# Patient Record
Sex: Female | Born: 1935 | Race: White | Hispanic: No
Health system: Midwestern US, Academic
[De-identification: ages and names within clinical notes are randomized; demographics above are authoritative.]

---

## 2017-06-01 ENCOUNTER — Ambulatory Visit: Admit: 2017-06-01 | Discharge: 2017-06-02 | Payer: MEDICARE

## 2017-06-02 ENCOUNTER — Ambulatory Visit: Admit: 2017-06-02 | Discharge: 2017-06-03 | Payer: MEDICARE

## 2017-06-02 ENCOUNTER — Encounter: Admit: 2017-06-02 | Discharge: 2017-06-02 | Payer: MEDICARE

## 2017-06-02 DIAGNOSIS — I4892 Unspecified atrial flutter: Principal | ICD-10-CM

## 2017-06-02 DIAGNOSIS — R42 Dizziness and giddiness: ICD-10-CM

## 2017-06-02 DIAGNOSIS — R600 Localized edema: ICD-10-CM

## 2017-06-02 DIAGNOSIS — R55 Syncope and collapse: ICD-10-CM

## 2017-06-02 DIAGNOSIS — I480 Paroxysmal atrial fibrillation: ICD-10-CM

## 2017-06-02 DIAGNOSIS — E785 Hyperlipidemia, unspecified: Principal | ICD-10-CM

## 2017-06-02 MED ORDER — POTASSIUM CHLORIDE 10 MEQ PO TBER
10 meq | ORAL_CAPSULE | Freq: Every day | ORAL | 3 refills | 30.00000 days | Status: AC
Start: 2017-06-02 — End: 2018-05-23

## 2017-06-02 MED ORDER — FUROSEMIDE 20 MG PO TAB
20 mg | ORAL_TABLET | Freq: Every morning | ORAL | 3 refills | 90.00000 days | Status: AC
Start: 2017-06-02 — End: 2017-11-28

## 2017-06-08 ENCOUNTER — Encounter: Admit: 2017-06-08 | Discharge: 2017-06-08 | Payer: MEDICARE

## 2017-06-09 ENCOUNTER — Encounter: Admit: 2017-06-09 | Discharge: 2017-06-09 | Payer: MEDICARE

## 2017-06-09 DIAGNOSIS — R600 Localized edema: Principal | ICD-10-CM

## 2017-06-09 LAB — BASIC METABOLIC PANEL
Lab: 15 — ABNORMAL HIGH (ref 0–14)
Lab: 9.3
Lab: 1.1 K/UL — ABNORMAL HIGH (ref 0.57–1.11)
Lab: 102 K/UL — ABNORMAL HIGH (ref 1.8–7.0)
Lab: 138 % (ref >60)
Lab: 20 10*3/uL (ref 0–0.80)
Lab: 25 10*3/uL (ref 1.0–4.8)
Lab: 4.3 % (ref >60)
Lab: 83 10*3/uL (ref 0–0.20)

## 2017-06-22 ENCOUNTER — Encounter: Admit: 2017-06-22 | Discharge: 2017-06-22 | Payer: MEDICARE

## 2017-06-22 NOTE — Telephone Encounter
Amy, HHRN called and states that patient has been feeling dizzy on and off and her blood pressure was low.  Pt's blood pressure today was 105/55.  Called pt to discuss and spoke with pt's daughter.  She states that she has been having these days on and off where she just doesn't feel very good and has dizziness and fatigue.  Her daughter states that some days, it's like her mom has trouble getting her thoughts in order.  Offered an appointment on 6/28 with TLR tomorrow in [place].  Pt would like to keep scheduled appointment with Dr. Ricard Dillon on 7/10.  They will callback if symptoms worsen or if any new symptoms arise.

## 2017-07-05 ENCOUNTER — Ambulatory Visit: Admit: 2017-07-05 | Discharge: 2017-07-06 | Payer: MEDICARE

## 2017-07-05 ENCOUNTER — Encounter: Admit: 2017-07-05 | Discharge: 2017-07-05 | Payer: MEDICARE

## 2017-07-05 DIAGNOSIS — I4892 Unspecified atrial flutter: ICD-10-CM

## 2017-07-05 DIAGNOSIS — I480 Paroxysmal atrial fibrillation: ICD-10-CM

## 2017-07-05 DIAGNOSIS — R55 Syncope and collapse: ICD-10-CM

## 2017-07-05 DIAGNOSIS — E785 Hyperlipidemia, unspecified: Principal | ICD-10-CM

## 2017-07-05 NOTE — Progress Notes
Date of Service: 07/05/2017    Marilyn Cowan is a 83 y.o. female.       HPI     He was in the [place] office today with her daughter.  She had a dizzy spell last Friday.  Her daughter says that she's having them about once weekly.  The episodes typically last a few minutes.  She gets near syncopal when these episodes happen.    We have talked about the possibility of admitting her to [facility] to switch antiarrhythmic medications but it occurs to me that these episodes of lightheadedness may actually occur when she is converting from atrial fibrillation back to sinus rhythm.  She probably has enough sinus node dysfunction that she has a bit of a pause at the time of conversion and this is probably what is causing these episodes of lightheadedness.  I am not sure that she can even tell that she is in atrial fibrillation anymore and I really question whether a rhythm control strategy is all that important anymore.  I suspect if we discontinue the flecainide these episodes of lightheadedness will resolve completely and we will even need to consider the possibility of a pacemaker or a different antiarrhythmic medication.    We talked all this over today and I think both Bebe and her daughter think it sounds like a good idea to simply stop the flecainide and see how she does subsequently.         Vitals:    07/05/17 1055 07/05/17 1106   BP: 140/74 130/76   Pulse: 71    Weight: 65 kg (143 lb 6.4 oz)    Height: 1.626 m (5' 4)      Body mass index is 24.61 kg/m???.     Past Medical History  Patient Active Problem List    Diagnosis Date Noted   ??? Localized edema 06/02/2017   ??? Paroxysmal atrial fibrillation (HCC) 06/02/2017   ??? Thrombocytosis (HCC) 01/08/2014     12/18/13:  Platelet count 832,000.  WBC 9.6, Hgb 13.6.  Normal WBC differential.  01/23/2014:  Hematology consultation ([facility]):  JAK-2 mutation positive.  Hydrea started.  Supplemental iron started for mild deficiency 01/18/14 Abdominal Ultrasound (ordered by Hematology):  12.5 mm simple cyst lower pole L kidney.  Post chole.  Otherwise unremarkable.  Spleen/liver OK.     ??? Macular degeneration 07/17/2013   ??? 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 Paroxysmal atrial flutter (HCC) 05/13/2012     4/7 through 04/05/17 [facility] admission for paroxysmal atrial fibrillation, presenting as fatigue and dyspnea.  Treated with IV diltiazem, converted spontaneously to sinus rhythm.  Eliquis started.       ??? Lightheadedness 05/12/2012   ??? Chest pain 12/22/2011     11/10/2011 [facility] for Mid sternal chest pain.     11/15/2006 This study is low probability for significant jeopardized ischemic myocardium. Some mild defects at the insertion site of the right ventricular myocardium do not appear to be statistically significant. Otherwise, all myocardial segments are definitely viable. Global left ventricular function is within normal limits. Other high risk indicators are not noted.    05/15/12: Normal coronaries with borderline decreased EF, per cath by Dr. Steward Ros    12/18/13:  [facility] ED visit for chest pain, vertigo.  Troponin < 0.02.  Pro-BNP 2402 (0-1800 normal).  TSH 2.82.  pCXR normal (no pulmonary vascular congestion).  EKG:  Sinus rhythm, LBBB.         ??? Hyperlipidemia 12/22/2011  History of myalgia on statin in the past    2004 - Last lipid profile in [facility]:  Total 204, LDL 128, HDL 42, Trig 122.     ??? Pre-syncope 12/22/2011     01/15/2014- Duplex scan carotid-[facility]-Impression:  1.  Some minimal atherosclerotic plaque evident bilaterally in the carotid bulb and origin internal carotid arteries without evident significant stenosis.  Findings consistent with less than a 50% diameter stenosis.2.  Cephalad vertebral flow           Review of Systems   Constitution: Negative.   HENT: Negative.    Eyes: Negative.    Cardiovascular: Positive for dyspnea on exertion, irregular heartbeat, leg swelling and near-syncope.   Respiratory: Negative.    Endocrine: Negative.    Hematologic/Lymphatic: Negative.    Skin: Negative.    Musculoskeletal: Negative.    Gastrointestinal: Negative.    Genitourinary: Negative.    Neurological: Negative.    Psychiatric/Behavioral: Negative.    Allergic/Immunologic: Negative.        Physical Exam    Physical Exam   General Appearance: no distress   Skin: warm, no ulcers or xanthomas   Digits and Nails: no cyanosis or clubbing   Eyes: conjunctivae and lids normal, pupils are equal and round   Teeth/Gums/Palate: dentition unremarkable, no lesions   Lips & Oral Mucosa: no pallor or cyanosis   Neck Veins: normal JVP , neck veins are not distended   Thyroid: no nodules, masses, tenderness or enlargement   Chest Inspection: chest is normal in appearance   Respiratory Effort: breathing comfortably, no respiratory distress   Auscultation/Percussion: lungs clear to auscultation, no rales or rhonchi, no wheezing   PMI: PMI not enlarged or displaced   Cardiac Rhythm: irregular rhythm and normal rate   Cardiac Auscultation: S1, S2 normal, no rub, no gallop   Murmurs: no murmur   Peripheral Circulation: normal peripheral circulation   Carotid Arteries: normal carotid upstroke bilaterally, no bruits   Radial Arteries: normal symmetric radial pulses   Abdominal Aorta: no abdominal aortic bruit   Pedal Pulses: normal symmetric pedal pulses   Lower Extremity Edema: no lower extremity edema   Abdominal Exam: soft, non-tender, no masses, bowel sounds normal   Liver & Spleen: no organomegaly   Gait & Station: walks without assistance   Muscle Strength: normal muscle tone   Orientation: oriented to time, place and person   Affect & Mood: appropriate and sustained affect   Language and Memory: patient responsive and seems to comprehend information   Neurologic Exam: neurological assessment grossly intact   Other: moves all extremities      Cardiovascular Studies EKG:  Atrial fibrillation, rate 71.  LBBB.    Problems Addressed Today  Encounter Diagnoses   Name Primary?   ??? Paroxysmal atrial flutter (HCC) Yes   ??? Paroxysmal atrial fibrillation (HCC)        Assessment and Plan       Paroxysmal atrial flutter (HCC)  She could not tell if she was in atrial fibrillation today.  I think we pretty well decided to abandon the rhythm control strategy in atrial fibrillation on anticoagulation.  Hopefully this will help to resolve any need for a pacemaker or hospitalization to switch antiarrhythmic medications.    Fortunately she is tolerating oral anticoagulation without significant.  She might be a candidate for a left atrial appendage occlusion device in the future if anticoagulation becomes more challenge.      Current Medications (including  today's revisions)  ??? apixaban (ELIQUIS) 5 mg tablet Take 5 mg by mouth twice daily.   ??? [START ON 09/06/2017] clindamycin(+) (CLEOCIN) 300 mg capsule Take 2 Capsules by mouth 30 minutes prior to procedure on 09/06/17   ??? diltiazem CD (CARDIZEM CD) 240 mg capsule Take 240 mg by mouth daily.   ??? furosemide (LASIX) 20 mg tablet Take 1 tablet by mouth every morning.   ??? HYDROXYUREA PO Take 500 mg by mouth. 1 tablet 4 days weekly, 2 tablets 3 days weekly   ??? potassium chloride (K-DUR) 10 mEq tablet Take 1 tablet by mouth daily. Take with a meal and a full glass of water.

## 2017-07-22 ENCOUNTER — Encounter: Admit: 2017-07-22 | Discharge: 2017-07-22 | Payer: MEDICARE

## 2017-07-22 DIAGNOSIS — I498 Other specified cardiac arrhythmias: ICD-10-CM

## 2017-07-22 DIAGNOSIS — I4892 Unspecified atrial flutter: Principal | ICD-10-CM

## 2017-07-22 NOTE — Telephone Encounter
Called and discussed Dr. Ricard Dillon recommendations with patient.  She is agreeable to plan.  Scheduled her to have holter placed in [place] on Tuesday at 1:00.  Pt confirmed appointment time and location.

## 2017-07-22 NOTE — Telephone Encounter
-----   Message from Michiel Cowboy, MD sent at 07/22/2017  1:18 PM CDT -----  So Marilyn Cowan feels a lot better off the flecainide--I think it's because she's just in AF all the time and not converting back and forth.    Anyway, can you please call her to set up a 48-hour Holter monitor some time in the next week or 2?  Thanks!

## 2017-07-26 ENCOUNTER — Ambulatory Visit: Admit: 2017-07-26 | Discharge: 2017-07-27 | Payer: MEDICARE

## 2017-07-26 ENCOUNTER — Encounter: Admit: 2017-07-26 | Discharge: 2017-07-26 | Payer: MEDICARE

## 2017-07-26 DIAGNOSIS — I498 Other specified cardiac arrhythmias: ICD-10-CM

## 2017-07-26 DIAGNOSIS — I4892 Unspecified atrial flutter: Principal | ICD-10-CM

## 2017-07-26 NOTE — Progress Notes
Ordering Physician:Dr. Tyson Alias   Holter [phone] Card number [phone]  Hours:48 hrs  VV:[id number] atrial flutter  Location: [facility]  Technician applied Holter:TV

## 2017-08-02 ENCOUNTER — Encounter: Admit: 2017-08-02 | Discharge: 2017-08-02 | Payer: MEDICARE

## 2017-08-11 ENCOUNTER — Encounter: Admit: 2017-08-11 | Discharge: 2017-08-11 | Payer: MEDICARE

## 2017-08-11 NOTE — Progress Notes
Patient presented to clinic for nursing visit for blood pressure check.  Today blood pressure is 124/74 p 90.  Patient reports she is feeling better since stopping the flecainide.  She does report a couple of episodes of dizziness, but only lasting a few seconds.  Discussed with SDO, no change in current care plan.  Continue current medications and pt has f/u schedule in Oct. Asked pt to call if she has any questions or concerns.  Pt verbalizes understanding and agrees with plan of care.

## 2017-08-31 ENCOUNTER — Encounter: Admit: 2017-08-31 | Discharge: 2017-08-31 | Payer: MEDICARE

## 2017-08-31 DIAGNOSIS — R55 Syncope and collapse: Principal | ICD-10-CM

## 2017-08-31 MED ORDER — CLINDAMYCIN HCL 300 MG PO CAP
ORAL_CAPSULE | 0 refills | Status: AC
Start: 2017-08-31 — End: ?

## 2017-09-01 ENCOUNTER — Encounter: Admit: 2017-09-01 | Discharge: 2017-09-01 | Payer: MEDICARE

## 2017-09-01 NOTE — Progress Notes
Medicare is listed as patient's primary insurance coverage.  Pre-certification is not required for hospitalizations.

## 2017-09-03 NOTE — Assessment & Plan Note
She could not tell if she was in atrial fibrillation today.  I think we pretty well decided to abandon the rhythm control strategy in atrial fibrillation on anticoagulation.  Hopefully this will help to resolve any need for a pacemaker or hospitalization to switch antiarrhythmic medications.    Fortunately she is tolerating oral anticoagulation without significant.  She might be a candidate for a left atrial appendage occlusion device in the future if anticoagulation becomes more challenge.

## 2017-09-06 ENCOUNTER — Encounter: Admit: 2017-09-06 | Discharge: 2017-09-06 | Payer: MEDICARE

## 2017-09-06 ENCOUNTER — Ambulatory Visit: Admit: 2017-09-06 | Discharge: 2017-09-06 | Payer: MEDICARE

## 2017-09-06 DIAGNOSIS — I481 Persistent atrial fibrillation: ICD-10-CM

## 2017-09-06 DIAGNOSIS — R55 Syncope and collapse: Principal | ICD-10-CM

## 2017-09-06 DIAGNOSIS — E785 Hyperlipidemia, unspecified: Principal | ICD-10-CM

## 2017-09-06 DIAGNOSIS — I480 Paroxysmal atrial fibrillation: ICD-10-CM

## 2017-09-06 DIAGNOSIS — I4892 Unspecified atrial flutter: ICD-10-CM

## 2017-09-06 NOTE — Progress Notes
Date of Service: 09/06/2017    Marilyn Cowan is a 83 y.o. female.       HPI     Marilyn Cowan presents to my office today in electrophysiology follow-up for history of atrial arrhythmias.  As you know, she is a pleasant 83 year old female with a past medical history of persistent atrial arrhythmias on anticoagulation, macular degeneration, and hyperlipidemia who is typically followed by my colleague Dr. Edwinna Areola.  Dr. Barry Dienes last saw Marilyn Cowan in the office back in July.  At that time, the patient was complaining of worsening dizzy spells.  It was felt to be at least partially due to flecainide.  After a long discussion, it was decided to accept her atrial fibrillation as permanent and discontinue the flecainide.    The patient subsequently underwent a 48 hour Holter monitor.  It showed atrial fibrillation throughout.  The average ventricular rate was 91 bpm with a range between 56 and 64 bpm.  She was started on low-dose beta-blockers.    Marilyn Cowan tells me that she continues to have some dizzy spells.  They are brief in nature and not as often as previous.  She has had x-rays of her back and neck done and has been told she has arthritis.  The patient did have one fall back in January when she tells me her left leg gave out.  She ended up fracturing her pelvis.  She remains on anticoagulation and has had no falls since then.         Vitals:    09/06/17 0849   BP: 120/80   Pulse: 78   Weight: 64.4 kg (142 lb)   Height: 1.626 m (5' 4)     Body mass index is 24.37 kg/m???.     Past Medical History  Patient Active Problem List    Diagnosis Date Noted   ??? Localized edema 06/02/2017   ??? Persistent atrial fibrillation (HCC) 06/02/2017   ??? Thrombocytosis (HCC) 01/08/2014     12/18/13:  Platelet count 832,000.  WBC 9.6, Hgb 13.6.  Normal WBC differential.  01/23/2014:  Hematology consultation ([facility]):  JAK-2 mutation positive.  Hydrea started.  Supplemental iron started for mild deficiency 01/18/14 Abdominal Ultrasound (ordered by Hematology):  12.5 mm simple cyst lower pole L kidney.  Post chole.  Otherwise unremarkable.  Spleen/liver OK.     ??? Macular degeneration 07/17/2013   ??? 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 Paroxysmal atrial flutter (HCC) 05/13/2012     4/7 through 04/05/17 [facility] admission for paroxysmal atrial fibrillation, presenting as fatigue and dyspnea.  Treated with IV diltiazem, converted spontaneously to sinus rhythm.  Eliquis started.       ??? Lightheadedness 05/12/2012   ??? Chest pain 12/22/2011     11/10/2011 [facility] for Mid sternal chest pain.     11/15/2006 This study is low probability for significant jeopardized ischemic myocardium. Some mild defects at the insertion site of the right ventricular myocardium do not appear to be statistically significant. Otherwise, all myocardial segments are definitely viable. Global left ventricular function is within normal limits. Other high risk indicators are not noted.    05/15/12: Normal coronaries with borderline decreased EF, per cath by Dr. Steward Ros    12/18/13:  [facility] ED visit for chest pain, vertigo.  Troponin < 0.02.  Pro-BNP 2402 (0-1800 normal).  TSH 2.82.  pCXR normal (no pulmonary vascular congestion).  EKG:  Sinus rhythm, LBBB.         ???  Hyperlipidemia 12/22/2011     History of myalgia on statin in the past    2004 - Last lipid profile in [facility]:  Total 204, LDL 128, HDL 42, Trig 122.     ??? Pre-syncope 12/22/2011     01/15/2014- Duplex scan carotid-[facility]-Impression:  1.  Some minimal atherosclerotic plaque evident bilaterally in the carotid bulb and origin internal carotid arteries without evident significant stenosis.  Findings consistent with less than a 50% diameter stenosis.2.  Cephalad vertebral flow           Review of Systems   Constitution: Negative.   HENT: Positive for hearing loss.    Eyes: Positive for blurred vision. Cardiovascular: Positive for irregular heartbeat, leg swelling and near-syncope.   Respiratory: Positive for shortness of breath and sputum production.    Endocrine: Negative.    Hematologic/Lymphatic: Bruises/bleeds easily.   Skin: Positive for dry skin.   Musculoskeletal: Positive for arthritis, back pain, joint pain, muscle cramps, neck pain and stiffness.   Gastrointestinal: Positive for bowel incontinence, flatus and jaundice.   Genitourinary: Positive for frequency and nocturia.   Neurological: Positive for excessive daytime sleepiness.   Psychiatric/Behavioral: Negative.    Allergic/Immunologic: Negative.        Physical Exam  General Appearance: elderly, frail in no acute distress  Skin: warm, moist, no ulcers  HEENT: extraocular movements intact, oropharynx clear  Neck Veins: neck veins are flat, neck veins are not distended  Carotid Arteries: normal carotid upstroke bilaterally, no bruits  Chest Inspection: chest is normal in appearance  Auscultation/Percussion: lungs clear to auscultation, no rales, rhonchi, or wheezing  Cardiac Rhythm: regular rhythm and normal rate  Cardiac Auscultation: Normal S1 & S2, no S3 or S4, no rub  Murmurs: no cardiac murmurs   Extremities: no lower extremity edema; 1+ symmetric distal pulses  Abdominal Exam: soft, non-tender, no masses, bowel sounds normal  Liver & Spleen: no organomegaly  Neurologic Exam: neurological assessment grossly intact      Cardiovascular Studies  12 lead EKG:  Atrial fibrillation, ventricular rate 78 bpm, LBBB, single PVC, QRSd 148 msec, QTc 497 msec    Problems Addressed Today  Encounter Diagnoses   Name Primary?   ??? Persistent atrial fibrillation (HCC) Yes       Assessment and Plan     Persistent atrial fibrillation (HCC)  Her atrial fibrillation is now considered permanent.  Marilyn Cowan feels less dizzy off of flecainide although she is still having events.  We discussed the option of implantable loop recorder placement to look for bradyarrhythmias causing her symptoms.  I reviewed the procedure at length with the patient and daughter including its risks and benefits.  After long discussion, she has decided to proceed.    We will keep you up to date with results of procedures as they occur.    Current Medications (including today's revisions)  ??? apixaban (ELIQUIS) 5 mg tablet Take 5 mg by mouth twice daily.   ??? clindamycin(+) (CLEOCIN) 300 mg capsule Take 2 Capsules by mouth 30 minutes prior to procedure on 09/06/17   ??? diltiazem CD (CARDIZEM CD) 240 mg capsule Take 240 mg by mouth daily.   ??? furosemide (LASIX) 20 mg tablet Take 1 tablet by mouth every morning.   ??? HYDROXYUREA PO Take 500 mg by mouth. 1 tablet 4 days weekly, 2 tablets 3 days weekly   ??? potassium chloride (K-DUR) 10 mEq tablet Take 1 tablet by mouth daily. Take with a meal and  a full glass of water.

## 2017-09-06 NOTE — Assessment & Plan Note
Her atrial fibrillation is now considered permanent.  Marilyn Cowan feels less dizzy off of flecainide although she is still having events.  We discussed the option of implantable loop recorder placement to look for bradyarrhythmias causing her symptoms.  I reviewed the procedure at length with the patient and daughter including its risks and benefits.  After long discussion, she has decided to proceed.

## 2017-09-12 ENCOUNTER — Encounter: Admit: 2017-09-12 | Discharge: 2017-09-12 | Payer: MEDICARE

## 2017-09-12 NOTE — Telephone Encounter
09/06/17- Dr. Lindwood Qua office visit notes-  "Persistent atrial fibrillation [facility])   Her atrial fibrillation is now considered permanent.  Mrs. Stoermer feels less dizzy off of flecainide although she is still having events.  We discussed the option of implantable loop recorder placement to look for bradyarrhythmias causing her symptoms.  I reviewed the  procedure at length with the patient and daughter including its risks and benefits.  After long discussion, she has decided to proceed."      I called the patient to follow up on the ILR transmission. The pt states she has been feeling pretty good. She remembers having an episode yesterday while sitting in the chair with her feet up and she developed dizziness that lasted just a few minutes and then resolved. She does not remember other symptoms to correlate with the transmission.

## 2017-09-12 NOTE — Telephone Encounter
-----   Message from [place] sent at 09/12/2017  8:00 AM CDT -----  Regarding: sdo linq  Presenting EGM: 09/12/17 @ 0004 AF 60's-80's    Initial report received and reviewed 1 symptom activation, 3 AF events. Pt with permanent AF, AF, CareAlerts turned off.    # 4- Symptom- 09/09/17 @ 1300- AF 70's-120's with ectopy  # 3- AF- 09/07/17 @ 1823- event in progress- AF 50's-100's with ectopy    Please see scanned data sheets for further review, will continue to monitor.  Results routed to Dr. Ricard Dillon for signature and review.   ___________________________________________________________________

## 2017-09-13 ENCOUNTER — Ambulatory Visit: Admit: 2017-09-12 | Discharge: 2017-09-13 | Payer: MEDICARE

## 2017-09-13 ENCOUNTER — Encounter: Admit: 2017-09-13 | Discharge: 2017-09-13 | Payer: MEDICARE

## 2017-09-13 DIAGNOSIS — R55 Syncope and collapse: Principal | ICD-10-CM

## 2017-09-13 NOTE — Progress Notes
. (  Left) prepectoral incision is clean, dry, well approximated, and healing without evidence of drainage or discharge. Steri-Strips dry and intact. Pt reports no adverse symptoms. Incision care, including signs and symptoms of infection, reviewed(as below). Pt verbalized understanding and will remain in phone contact.    You may shower once dressing removed at follow up appointment; however avoid direct contact with the incision (allow the water to hit the back of your shoulder rather than directly on the incision).    Do not submerge incision in tub, pool, hot tub, or lake for 4 weeks.    Unless your incision is bleeding or draining, keep it open to air.    Avoid applying deodorants, powders, creams, lotions, etc. to your incision for 4 weeks.    Usually there are no stitches to be removed. Steri-strips will begin to fall off in 10-14 days. If they remain after 2 weeks, gently remove them when they are damp after a shower.    Your incision should gradually look better each day. Please notify our office immediately if you notice any of the following:   -an increase in swelling or redness   -any drainage   -increasing pain at the incision site  -fever over 100 degrees

## 2017-09-20 ENCOUNTER — Encounter: Admit: 2017-09-20 | Discharge: 2017-09-20 | Payer: MEDICARE

## 2017-09-23 ENCOUNTER — Encounter: Admit: 2017-09-23 | Discharge: 2017-09-23 | Payer: MEDICARE

## 2017-09-27 NOTE — Telephone Encounter
-----   Message from Vickie Epley, RN sent at 09/27/2017  3:44 PM CDT -----  Regarding: FW: SDO LINQ pt with symptom triggered events      ----- Message -----  From: [name]  Sent: 09/27/2017   2:49 PM  To: Vickie Epley, RN  Subject: [facility] LINQ pt with symptom triggered events        Presenting EGM on 09/27/2017 @ 00:04:50 shows AF.     Reviewed Event Report shows:    #14 Symptom 10/1 @ 12:13 the 30 sec strip shows AF with V rates between 60's-130's.  #12 Symptom 9/29 @ 07:28 the 30 sec strip shows AF with RVR with V rates between 90's-190's.     Please see scanned data sheets for further review.  Results routed to Dr. Ricard Dillon for signature and review.

## 2017-09-27 NOTE — Progress Notes
Patient states problems with vision.  Believes back pain triggering elevated hr and dizziness.

## 2017-09-30 ENCOUNTER — Encounter: Admit: 2017-09-30 | Discharge: 2017-09-30 | Payer: MEDICARE

## 2017-09-30 NOTE — Telephone Encounter
Spoke with Marilyn Cowan and she was aware of all the events. States she rests and it will go away but does have diaphoresis, dizziness, and "soa". She does not know her BP or pulse at that time.  Marilyn Cowan states she feels "ok" now. She lives next door to her Daughter and has a " first alert" to get help quickly.  Marilyn Cowan advised to initiate the first alert if the episode does not terminate or if symptomatic for an extended time. She states "Oh, I know to do that". Marilyn Cowan has been taking all her medications including eliquis and she has an appointment with Dr.Owens on 10/04/17.

## 2017-09-30 NOTE — Telephone Encounter
-----   Message from Vickie Epley, RN sent at 09/30/2017  9:29 AM CDT -----  Regarding: FW: SDO LINQ pt with symptom triggered event      ----- Message -----  From: [name]  Sent: 09/30/2017   9:15 AM  To: Vickie Epley, RN  Subject: SDO LINQ pt with symptom triggered event         Presenting EGM on 09/30/2017 @ 00:04:50 shows AF/AFL .     Reviewed Event Report shows three symptom triggered events occurred though one was rec'd:    #17 Symptom 10/4 @ 07:12 shows AF/AFL with V rates between 80's-170's.    Please see scanned data sheets for further review.  Results routed to Dr. Ricard Dillon for signature and review.

## 2017-10-04 ENCOUNTER — Encounter: Admit: 2017-10-04 | Discharge: 2017-10-04 | Payer: MEDICARE

## 2017-10-04 ENCOUNTER — Ambulatory Visit: Admit: 2017-10-04 | Discharge: 2017-10-05 | Payer: MEDICARE

## 2017-10-04 DIAGNOSIS — R55 Syncope and collapse: ICD-10-CM

## 2017-10-04 DIAGNOSIS — I4819 Other persistent atrial fibrillation: Principal | ICD-10-CM

## 2017-10-04 DIAGNOSIS — E785 Hyperlipidemia, unspecified: Principal | ICD-10-CM

## 2017-10-04 DIAGNOSIS — I4892 Unspecified atrial flutter: ICD-10-CM

## 2017-10-04 MED ORDER — DILTIAZEM HCL 180 MG PO CP24
180 mg | ORAL_CAPSULE | Freq: Every day | ORAL | 5 refills | 45.00000 days | Status: AC
Start: 2017-10-04 — End: 2017-11-24

## 2017-10-04 MED ORDER — APIXABAN 2.5 MG PO TAB
2.5 mg | ORAL_TABLET | Freq: Two times a day (BID) | ORAL | 11 refills | Status: AC
Start: 2017-10-04 — End: 2017-11-28

## 2017-10-04 NOTE — Assessment & Plan Note
The episodes of near syncope that she used to have do seem to have resolved after we stopped the flecainide.  I really think these were caused by pauses while she converted from atrial fibrillation back to sinus rhythm.  Now that she is in atrial fibrillation all the time these pauses no longer seem to be an issue.

## 2017-10-04 NOTE — Progress Notes
Date of Service: 10/04/2017    Marilyn Cowan is a 83 y.o. female.       HPI     That he was in the [place] office today with her daughter.  [place] and she lives next door to her daughter.  She gets along well and has not had any particular problems with TIA or stroke symptoms.  She has had no significant bleeding complications either.  She is pretty much unaware of her atrial fibrillation and denies any problems with palpitations.  The episodes of dizziness and lightheadedness that she has had in the past seem to have pretty much resolved.  Her weight seems to be stable although she is gradually losing a little bit of weight.         Vitals:    10/04/17 0839 10/04/17 0850   BP: 122/74 126/74   Pulse: 94    Weight: 63 kg (139 lb)    Height: 1.626 m (5' 4)      Body mass index is 23.86 kg/m???.     Past Medical History  Patient Active Problem List    Diagnosis Date Noted   ??? Localized edema 06/02/2017   ??? Persistent atrial fibrillation (HCC) 06/02/2017     ~2015-18 - Flecainide used effectively to suppress paroxysmal atrial arrhythmias    4/7 through 04/05/17 [facility] admission for paroxysmal atrial fibrillation, presenting as fatigue and dyspnea.  Treated with IV diltiazem, converted spontaneously to sinus rhythm.  Eliquis started.    05/2017 - flecainide discontinued due to episodes of near syncope attributed to sinus pauses when converted from AF to SR.       ??? Thrombocytosis (HCC) 01/08/2014     12/18/13:  Platelet count 832,000.  WBC 9.6, Hgb 13.6.  Normal WBC differential.  01/23/2014:  Hematology consultation ([facility]i):  JAK-2 mutation positive.  Hydrea started.  Supplemental iron started for mild deficiency    01/18/14 Abdominal Ultrasound (ordered by Hematology):  12.5 mm simple cyst lower pole L kidney.  Post chole.  Otherwise unremarkable.  Spleen/liver OK.     ??? Macular degeneration 07/17/2013   ??? 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 Lightheadedness 05/12/2012 ??? Chest pain 12/22/2011     11/10/2011 [facility] for Mid sternal chest pain.     11/15/2006 This study is low probability for significant jeopardized ischemic myocardium. Some mild defects at the insertion site of the right ventricular myocardium do not appear to be statistically significant. Otherwise, all myocardial segments are definitely viable. Global left ventricular function is within normal limits. Other high risk indicators are not noted.    05/15/12: Normal coronaries with borderline decreased EF, per cath by Dr. Steward Ros    12/18/13:  [facility] ED visit for chest pain, vertigo.  Troponin < 0.02.  Pro-BNP 2402 (0-1800 normal).  TSH 2.82.  pCXR normal (no pulmonary vascular congestion).  EKG:  Sinus rhythm, LBBB.         ??? Hyperlipidemia 12/22/2011     History of myalgia on statin in the past    2004 - Last lipid profile in [facility]:  Total 204, LDL 128, HDL 42, Trig 122.     ??? Pre-syncope 12/22/2011     01/15/2014- Duplex scan carotid-[facility]-Impression:  1.  Some minimal atherosclerotic plaque evident bilaterally in the carotid bulb and origin internal carotid arteries without evident significant stenosis.  Findings consistent with less than a 50% diameter stenosis.2.  Cephalad vertebral  flow           Review of Systems   Constitution: Negative.   HENT: Negative.    Eyes: Negative.    Cardiovascular: Positive for dyspnea on exertion and irregular heartbeat.   Respiratory: Positive for shortness of breath.    Endocrine: Negative.    Hematologic/Lymphatic: Negative.    Skin: Negative.    Musculoskeletal: Positive for arthritis, back pain, joint pain, neck pain and stiffness.   Gastrointestinal: Negative.    Genitourinary: Negative.    Neurological: Positive for dizziness and light-headedness.   Psychiatric/Behavioral: Negative.    Allergic/Immunologic: Negative.        Physical Exam    Physical Exam   General Appearance: no distress   Skin: warm, no ulcers or xanthomas Digits and Nails: no cyanosis or clubbing   Eyes: conjunctivae and lids normal, pupils are equal and round   Teeth/Gums/Palate: dentition unremarkable, no lesions   Lips & Oral Mucosa: no pallor or cyanosis   Neck Veins: normal JVP , neck veins are not distended   Thyroid: no nodules, masses, tenderness or enlargement   Chest Inspection: chest is normal in appearance   Respiratory Effort: breathing comfortably, no respiratory distress   Auscultation/Percussion: lungs clear to auscultation, no rales or rhonchi, no wheezing   PMI: PMI not enlarged or displaced   Cardiac Rhythm: regular rhythm and normal rate   Cardiac Auscultation: S1, S2 normal, no rub, no gallop   Murmurs: no murmur   Peripheral Circulation: normal peripheral circulation   Carotid Arteries: normal carotid upstroke bilaterally, no bruits   Radial Arteries: normal symmetric radial pulses   Abdominal Aorta: no abdominal aortic bruit   Pedal Pulses: normal symmetric pedal pulses   Lower Extremity Edema: no lower extremity edema   Abdominal Exam: soft, non-tender, no masses, bowel sounds normal   Liver & Spleen: no organomegaly   Gait & Station: walks without assistance   Muscle Strength: normal muscle tone   Orientation: oriented to time, place and person   Affect & Mood: appropriate and sustained affect   Language and Memory: patient responsive and seems to comprehend information   Neurologic Exam: neurological assessment grossly intact   Other: moves all extremities        Problems Addressed Today  Encounter Diagnoses   Name Primary?   ??? Persistent atrial fibrillation (HCC)    ??? Paroxysmal atrial flutter (HCC)    ??? Pre-syncope        Assessment and Plan       Persistent atrial fibrillation (HCC)  She's definitely over 82 and her renal function and weight are just at the border for a reduced (2.5 mg BID) Eliquis dosage so I went ahead and reduced the dosage today.    Her event monitor shows all AF with a pretty good rate control so I think we can try to relax the diltiazem dosage somewhat to help bring her BP up somewhat.    Pre-syncope  The episodes of near syncope that she used to have do seem to have resolved after we stopped the flecainide.  I really think these were caused by pauses while she converted from atrial fibrillation back to sinus rhythm.  Now that she is in atrial fibrillation all the time these pauses no longer seem to be an issue.      Current Medications (including today's revisions)  ??? acetaminophen (TYLENOL) 500 mg tablet Take 500 mg by mouth as Needed for Pain. Max of 4,000 mg of acetaminophen  in 24 hours.   ??? apixaban (ELIQUIS) 2.5 mg tablet Take one tablet by mouth twice daily.   ??? diltiazem CD (CARDIZEM CD) 180 mg capsule Take one capsule by mouth daily.   ??? furosemide (LASIX) 20 mg tablet Take 1 tablet by mouth every morning.   ??? HYDROXYUREA PO Take 500 mg by mouth. 1 tablet 4 days weekly, 2 tablets 3 days weekly   ??? potassium chloride (K-DUR) 10 mEq tablet Take 1 tablet by mouth daily. Take with a meal and a full glass of water.

## 2017-10-04 NOTE — Assessment & Plan Note
She's definitely over 82 and her renal function and weight are just at the border for a reduced (2.5 mg BID) Eliquis dosage so I went ahead and reduced the dosage today.    Her event monitor shows all AF with a pretty good rate control so I think we can try to relax the diltiazem dosage somewhat to help bring her BP up somewhat.

## 2017-10-06 ENCOUNTER — Ambulatory Visit: Admit: 2017-10-06 | Discharge: 2017-10-07 | Payer: MEDICARE

## 2017-10-07 DIAGNOSIS — I481 Persistent atrial fibrillation: ICD-10-CM

## 2017-10-07 DIAGNOSIS — R55 Syncope and collapse: Principal | ICD-10-CM

## 2017-10-17 ENCOUNTER — Encounter: Admit: 2017-10-17 | Discharge: 2017-10-17 | Payer: MEDICARE

## 2017-10-17 NOTE — Progress Notes
Information below noted. Chart review indicates pt is anticoagulated on Eliquis and this is consistent with findings since ILR implanted in Sept of this year. Information has already been sent to Dr. Tyson Alias [facility]) for review and pt sched to see [facility] in f/u in  Jan 2019. No further needs identified at this time.    ----- Message -----  From: [name]  Sent: 10/17/2017  12:04 PM  To: [name] Nurse [facility]  Subject: [place] pt with AF event and stored symptom  triggered events    MyChart message was sent to pt to request a remote transmission to see stored information.  Presenting EGM on 10/15/2017 @ 00:04:50 shows AF with ectopy.     Reviewed Event Report shows four symptom triggered events and one AF event occurred:    #30 Symptom 10/19 @ 10:12 the 30 sec strip shows AF with a variable V rate between 82-150 bpm.     Please see scanned data sheets for further review.  Results routed to Dr. Ricard Dillon for signature and review.

## 2017-10-28 ENCOUNTER — Encounter: Admit: 2017-10-28 | Discharge: 2017-10-28 | Payer: MEDICARE

## 2017-10-31 ENCOUNTER — Encounter: Admit: 2017-10-31 | Discharge: 2017-10-31 | Payer: MEDICARE

## 2017-10-31 NOTE — Telephone Encounter
Called pt to see if she was having symptoms.  Pt denies any chest pain, or palpitations.  She states that she does occasionally have "a shortness that lets you know something is going on".  She states that she is getting a stomach bug and has been having some nausea and diarrhea.  No other complaints.  She will call us back with any additional concerns or problems.

## 2017-10-31 NOTE — Telephone Encounter
-----   Message from Army Melia sent at 10/31/2017 11:08 AM CST -----  Regarding: SDO LINQ pt with symptom triggered events  Presenting EGM on 10/31/2017 @ 00:04:50 shows AF with ectopy.     Reviewed Event Report shows:    #43 Symptom 11/4 @ 10:00 shows AF with ectopy.     Please see scanned data sheets for further review.  Results routed to Dr. Ricard Dillon for signature and review.

## 2017-11-02 LAB — COMPREHENSIVE METABOLIC PANEL
Lab: 0.8
Lab: 1
Lab: 10
Lab: 102 — ABNORMAL HIGH (ref 27.0–31.0)
Lab: 108
Lab: 136
Lab: 17 — ABNORMAL HIGH (ref 0–14)
Lab: 18
Lab: 20
Lab: 21 — ABNORMAL LOW (ref 23–31)
Lab: 28
Lab: 3.8
Lab: 7.4
Lab: 73

## 2017-11-02 LAB — CBC
Lab: 13
Lab: 3.4 — ABNORMAL LOW (ref 4.20–5.40)
Lab: 6.6

## 2017-11-02 LAB — TROPONIN-I

## 2017-11-07 ENCOUNTER — Ambulatory Visit: Admit: 2017-11-07 | Discharge: 2017-11-07 | Payer: MEDICARE

## 2017-11-07 DIAGNOSIS — R55 Syncope and collapse: Principal | ICD-10-CM

## 2017-11-11 ENCOUNTER — Encounter: Admit: 2017-11-11 | Discharge: 2017-11-11 | Payer: MEDICARE

## 2017-11-11 NOTE — Telephone Encounter
lmom requested call back if pt needs sooner appt.

## 2017-11-11 NOTE — Telephone Encounter
Called patient left message on machine requested call back to discuss symptoms possible work in to sooner appt.

## 2017-11-11 NOTE — Telephone Encounter
-----   Message from Vickie Epley, RN sent at 11/10/2017  3:41 PM CST -----  Regarding: FW: sdo linq      ----- Message -----  From: [name]  Sent: 11/10/2017   3:35 PM  To: Vickie Epley, RN  Subject: sdo [name], RNnotified of abnormal transmission   Presenting EGM: 11/10/17 @ 0004 AF SR 79 bpm    Event report received and reviewed 1 new symptom activation  # 49- Symptom- 11/08/17 @ 1741- AF 70's-130's with ectopy    Please see scanned data sheets for further review, will continue to monitor.  Results routed to Dr. Ricard Dillon for signature and review.   ___________________________________________________________________

## 2017-11-11 NOTE — Telephone Encounter
-----   Message from Loralie Champagne sent at 11/11/2017  9:42 AM CST -----  Regarding: Hosp F/U  Pt was scheduled on 11/20 for Hosp F/U, [facility] cxld clinic that day so I r/s with [facility] on 12/13. Should patient be seen sooner if so do you have a work in available?      Thank you.

## 2017-11-16 ENCOUNTER — Encounter: Admit: 2017-11-16 | Discharge: 2017-11-16 | Payer: MEDICARE

## 2017-11-16 NOTE — Telephone Encounter
Called and discussed with patient.  No new symptoms.  Pt is still feeling very fatigued and occasionally dizzy.  Pt has a follow up appointment scheduled with Dr. Ricard Dillon' on 11/29.  She confirmed appointment time and location.

## 2017-11-16 NOTE — Telephone Encounter
-----   Message from Vickie Epley, RN sent at 11/16/2017  8:43 AM CST -----  Regarding: FW: SDO LINQ pt with symptom triggered event      ----- Message -----  From: Army Melia  Sent: 11/16/2017   8:28 AM  To: Vickie Epley, RN  Subject: SDO LINQ pt with symptom triggered event         Presenting EGM on 11/16/2017 @ 00:04:50 shows AFL.     Reviewed Event Report shows:    #50 Symptom 11/20 @ 08:40 shows AF with V rates between 90's-170's.    Please see scanned data sheets for further review.  Results routed to Dr. Ricard Dillon for signature and review.

## 2017-11-22 ENCOUNTER — Encounter: Admit: 2017-11-22 | Discharge: 2017-11-22 | Payer: MEDICARE

## 2017-11-22 NOTE — Telephone Encounter
Patient states fatigue with episodes.

## 2017-11-22 NOTE — Telephone Encounter
-----   Message from Vickie Epley, RN sent at 11/22/2017  2:24 PM CST -----  Regarding: FW: SDO LINQ pt with symptom triggered event      ----- Message -----  From: [name]  Sent: 11/22/2017   2:22 PM  To: Vickie Epley, RN  Subject: SDO LINQ pt with symptom triggered event         Presenting EGM on 11/22/2017 @ 00:04:50 shows AF.     Reviewed Event Report shows:    #51 Symptom 11/24 @ 07:44 shows AF with V rates between 90's-180's    Please see scanned data sheets for further review.  Results routed to Dr. Ricard Dillon for signature and review.

## 2017-11-23 LAB — CBC
Lab: 35
Lab: 388

## 2017-11-24 ENCOUNTER — Encounter: Admit: 2017-11-24 | Discharge: 2017-11-24 | Payer: MEDICARE

## 2017-11-24 ENCOUNTER — Ambulatory Visit: Admit: 2017-11-24 | Discharge: 2017-11-25 | Payer: MEDICARE

## 2017-11-24 DIAGNOSIS — I4819 Other persistent atrial fibrillation: ICD-10-CM

## 2017-11-24 DIAGNOSIS — R55 Syncope and collapse: ICD-10-CM

## 2017-11-24 DIAGNOSIS — R42 Dizziness and giddiness: ICD-10-CM

## 2017-11-24 DIAGNOSIS — I498 Other specified cardiac arrhythmias: Principal | ICD-10-CM

## 2017-11-24 DIAGNOSIS — E785 Hyperlipidemia, unspecified: Principal | ICD-10-CM

## 2017-11-24 DIAGNOSIS — I4892 Unspecified atrial flutter: ICD-10-CM

## 2017-11-24 NOTE — Assessment & Plan Note
We decided earlier this year to abandon a rhythm control strategy.  Unfortunately we have not been able to achieve good rate control because of low blood pressure.    I have suggested that we consider AV node ablation with implantation of a VVIR pacing system.  The question arises as to whether we should consider a biventricular pacing system and I have ordered an echocardiogram to be done Monday in conjunction with the patient's consultation in Dr. Nita Sickle clinic.

## 2017-11-24 NOTE — Progress Notes
Date of Service: 11/24/2017    INSIYA RUDIGER is a 83 y.o. female.       HPI     Foster was in the [place] office today with her daughter and granddaughter.  Her granddaughter is a vascular and Chief Strategy Officer who works at [employer].    Leisha has had recurring episodes of sudden weakness.  She has an implanted cardiac monitor and we have been able to confirm that her rhythm has not changed significantly during these episodes.  She does, however, have persistent atrial fibrillation with a poorly controlled ventricular rate.  We have tried to control her rate but are not limited by low blood pressure readings.  I suspect that these episodes of intermittent weakness are related to low blood pressure.  We had a fairly prolonged discussion today about the possibility of AV node ablation with permanent pacemaker.  Brynlie has a left bundle branch block and her most recent echocardiogram showed an ejection fraction of 45%.  The question of biventricular pacing arises and I have made arrangements for the patient to see Dr. Wallene Huh in our [place] office on Monday for consideration of this type of procedure.    Gizzel had gone with her daughter for phlebotomy and they went to [employer] afterwards.  While she was walking into the store she suddenly felt lightheaded and felt that she might fall.  They got assistance and she used a motorized chair for her shopping.  She says that she still felt a little bit weak when she got back to the car but she never fell.  She is been having episodes like this 2 or 3 times per week and they all seem to be when she is upright doing something during the day.    She does have a home blood pressure monitor and it sounds as if her systolic blood pressure at home tends to be in the range of 105-110 mmHg.  She has not been able to actually check her blood pressure during an episode of lightheadedness, although she was at her doctor's office yesterday and had an episode during which her systolic blood pressure was measured at 108 mmHg.    She denies any frank syncope.  There has been no associated chest discomfort or breathlessness with these episodes.  Her weight is stable and she denies problems with significant peripheral edema, although she does require a low dose of furosemide to help prevent the accumulation of ankle edema.       Vitals:    11/24/17 1241   BP: 104/72   Pulse: 48   Weight: 62.7 kg (138 lb 3.2 oz)   Height: 1.626 m (5' 4)     Body mass index is 23.72 kg/m???.     Past Medical History  Patient Active Problem List    Diagnosis Date Noted   ??? Localized edema 06/02/2017   ??? Persistent atrial fibrillation (HCC) 06/02/2017     ~2015-18 - Flecainide used effectively to suppress paroxysmal atrial arrhythmias    4/7 through 04/05/17 [facility] admission for paroxysmal atrial fibrillation, presenting as fatigue and dyspnea.  Treated with IV diltiazem, converted spontaneously to sinus rhythm.  Eliquis started.    05/2017 - flecainide discontinued due to episodes of near syncope attributed to sinus pauses when converted from AF to SR.       ??? Thrombocytosis (HCC) 01/08/2014     12/18/13:  Platelet count 832,000.  WBC 9.6, Hgb 13.6.  Normal WBC differential.  01/23/2014:  Hematology consultation ([facility]):  JAK-2 mutation positive.  Hydrea started.  Supplemental iron started for mild deficiency    01/18/14 Abdominal Ultrasound (ordered by Hematology):  12.5 mm simple cyst lower pole L kidney.  Post chole.  Otherwise unremarkable.  Spleen/liver OK.     ??? Macular degeneration 07/17/2013   ??? 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 Lightheadedness 05/12/2012   ??? Chest pain 12/22/2011     11/10/2011 [facility] for Mid sternal chest pain.     11/15/2006 This study is low probability for significant jeopardized ischemic myocardium. Some mild defects at the insertion site of the right ventricular myocardium do not appear to be statistically significant. Otherwise, all myocardial segments are definitely viable. Global left ventricular function is within normal limits. Other high risk indicators are not noted.    05/15/12: Normal coronaries with borderline decreased EF, per cath by Dr. Steward Ros    12/18/13:  [facility] ED visit for chest pain, vertigo.  Troponin < 0.02.  Pro-BNP 2402 (0-1800 normal).  TSH 2.82.  pCXR normal (no pulmonary vascular congestion).  EKG:  Sinus rhythm, LBBB.         ??? Hyperlipidemia 12/22/2011     History of myalgia on statin in the past    2004 - Last lipid profile in [facility]:  Total 204, LDL 128, HDL 42, Trig 122.     ??? Pre-syncope 12/22/2011     01/15/2014- Duplex scan carotid-[facility]-Impression:  1.  Some minimal atherosclerotic plaque evident bilaterally in the carotid bulb and origin internal carotid arteries without evident significant stenosis.  Findings consistent with less than a 50% diameter stenosis.2.  Cephalad vertebral flow           Review of Systems   Constitution: Positive for weakness and malaise/fatigue.   HENT: Positive for hearing loss.    Eyes: Negative.    Cardiovascular: Positive for irregular heartbeat.   Respiratory: Negative.    Endocrine: Negative.    Hematologic/Lymphatic: Negative.    Skin: Negative.    Musculoskeletal: Negative.    Gastrointestinal: Negative.    Genitourinary: Negative.    Neurological: Positive for dizziness.   Psychiatric/Behavioral: Positive for memory loss.   Allergic/Immunologic: Negative.        Physical Exam    Physical Exam   General Appearance: no distress   Skin: warm, no ulcers or xanthomas   Digits and Nails: no cyanosis or clubbing   Eyes: conjunctivae and lids normal, pupils are equal and round   Teeth/Gums/Palate: dentition unremarkable, no lesions   Lips & Oral Mucosa: no pallor or cyanosis   Neck Veins: normal JVP , neck veins are not distended   Thyroid: no nodules, masses, tenderness or enlargement Chest Inspection: chest is normal in appearance   Respiratory Effort: breathing comfortably, no respiratory distress   Auscultation/Percussion: lungs clear to auscultation, no rales or rhonchi, no wheezing   PMI: PMI not enlarged or displaced   Cardiac Rhythm: irregular rhythm and normal rate   Cardiac Auscultation: S1, S2 normal, no rub, no gallop   Murmurs: no murmur   Peripheral Circulation: normal peripheral circulation   Carotid Arteries: normal carotid upstroke bilaterally, no bruits   Radial Arteries: normal symmetric radial pulses   Abdominal Aorta: no abdominal aortic bruit   Pedal Pulses: normal symmetric pedal pulses   Lower Extremity Edema: no lower extremity edema   Abdominal Exam: soft, non-tender, no masses, bowel sounds normal   Liver & Spleen: no organomegaly  Gait & Station: walks without assistance   Muscle Strength: normal muscle tone   Orientation: oriented to time, place and person   Affect & Mood: appropriate and sustained affect   Language and Memory: patient responsive and seems to comprehend information   Neurologic Exam: neurological assessment grossly intact   Other: moves all extremities        Problems Addressed Today  Encounter Diagnoses   Name Primary?   ??? Atrial bigeminy Yes   ??? Persistent atrial fibrillation (HCC)    ??? Lightheadedness    ??? Pre-syncope        Assessment and Plan       Persistent atrial fibrillation (HCC)  We decided earlier this year to abandon a rhythm control strategy.  Unfortunately we have not been able to achieve good rate control because of low blood pressure.    I have suggested that we consider AV node ablation with implantation of a VVIR pacing system.  The question arises as to whether we should consider a biventricular pacing system and I have ordered an echocardiogram to be done Monday in conjunction with the patient's consultation in Dr. Johnanna Schneiders clinic.    Pre-syncope  I suppose the episodes of intermittent lightheadedness could be related to cardiac ischemia, but the patient underwent coronary arteriography in 2013 and had essentially no obstructive coronary disease at that time.  I mentioned this possibility to the patient's granddaughter today neither of [place] are very anxious to embark on a workup for obstructive coronary disease.  It would seem that our efforts to control the ventricular rate would be more helpful to the patient at this point.      Current Medications (including today's revisions)  ??? acetaminophen (TYLENOL) 500 mg tablet Take 500 mg by mouth as Needed for Pain. Max of 4,000 mg of acetaminophen in 24 hours.   ??? apixaban (ELIQUIS) 2.5 mg tablet Take one tablet by mouth twice daily.   ??? diltiazem (CARDIZEM) 120 mg tab Take 120 mg by mouth twice daily.   ??? furosemide (LASIX) 20 mg tablet Take 1 tablet by mouth every morning.   ??? HYDROXYUREA PO Take 500 mg by mouth. 1 tablet 3 days weekly, 2 tablets 4 days weekly   ??? potassium chloride (K-DUR) 10 mEq tablet Take 1 tablet by mouth daily. Take with a meal and a full glass of water.   ??? vit C/E/Zn/coppr/lutein/zeaxan (PRESERVISION AREDS-2 PO) Take  by mouth daily.

## 2017-11-24 NOTE — Assessment & Plan Note
I suppose the episodes of intermittent lightheadedness could be related to cardiac ischemia, but the patient underwent coronary arteriography in 2013 and had essentially no obstructive coronary disease at that time.  I mentioned this possibility to the patient's granddaughter today neither of [place] are very anxious to embark on a workup for obstructive coronary disease.  It would seem that our efforts to control the ventricular rate would be more helpful to the patient at this point.

## 2017-11-25 ENCOUNTER — Encounter: Admit: 2017-11-25 | Discharge: 2017-11-25 | Payer: MEDICARE

## 2017-11-25 DIAGNOSIS — I4819 Other persistent atrial fibrillation: ICD-10-CM

## 2017-11-25 DIAGNOSIS — I498 Other specified cardiac arrhythmias: Principal | ICD-10-CM

## 2017-11-28 ENCOUNTER — Ambulatory Visit: Admit: 2017-11-28 | Discharge: 2017-11-29 | Payer: MEDICARE

## 2017-11-28 ENCOUNTER — Ambulatory Visit: Admit: 2017-11-28 | Discharge: 2017-11-28 | Payer: MEDICARE

## 2017-11-28 ENCOUNTER — Encounter: Admit: 2017-11-28 | Discharge: 2017-11-28 | Payer: MEDICARE

## 2017-11-28 DIAGNOSIS — I4892 Unspecified atrial flutter: ICD-10-CM

## 2017-11-28 DIAGNOSIS — R42 Dizziness and giddiness: ICD-10-CM

## 2017-11-28 DIAGNOSIS — E785 Hyperlipidemia, unspecified: Principal | ICD-10-CM

## 2017-11-28 DIAGNOSIS — I498 Other specified cardiac arrhythmias: Principal | ICD-10-CM

## 2017-11-28 DIAGNOSIS — I4819 Other persistent atrial fibrillation: ICD-10-CM

## 2017-11-28 DIAGNOSIS — R55 Syncope and collapse: ICD-10-CM

## 2017-11-28 DIAGNOSIS — I480 Paroxysmal atrial fibrillation: ICD-10-CM

## 2017-11-28 DIAGNOSIS — R600 Localized edema: Principal | ICD-10-CM

## 2017-11-28 MED ORDER — DIGOXIN 125 MCG PO TAB
125 ug | ORAL_TABLET | Freq: Every day | ORAL | 3 refills | 30.00000 days | Status: AC
Start: 2017-11-28 — End: 2018-09-27

## 2017-11-28 MED ORDER — DILTIAZEM HCL 120 MG PO TAB
120 mg | ORAL_TABLET | Freq: Two times a day (BID) | ORAL | 3 refills | 90.00000 days | Status: AC
Start: 2017-11-28 — End: 2018-09-27

## 2017-11-28 MED ORDER — FUROSEMIDE 20 MG PO TAB
20 mg | ORAL_TABLET | Freq: Every morning | ORAL | 3 refills | 90.00000 days | Status: AC
Start: 2017-11-28 — End: 2018-08-24

## 2017-11-28 MED ORDER — PERFLUTREN LIPID MICROSPHERES 1.1 MG/ML IV SUSP
1-20 mL | Freq: Once | INTRAVENOUS | 0 refills | Status: CP
Start: 2017-11-28 — End: ?

## 2017-11-28 MED ORDER — APIXABAN 2.5 MG PO TAB
2.5 mg | ORAL_TABLET | Freq: Two times a day (BID) | ORAL | 3 refills | Status: AC
Start: 2017-11-28 — End: 2018-12-08

## 2017-11-28 NOTE — Progress Notes
Peripheral IV Insertion Note:  Patient Side: right  Line Orientation:Antecubital  IV Catheter Size: 20G  Number of Attempts:1.  IV capped and flushed with Normal Saline.  IV site without redness, swelling, or pain.  New dressing placed.    After procedure IV cannula removed intact and hemostasis achieved.    Procedure explained, questions answered and Definity administered per standard without complications.   Total of 2_ ml of Definity/NS given slow IVP per sonographer direction.

## 2017-12-07 ENCOUNTER — Ambulatory Visit: Admit: 2017-12-07 | Discharge: 2017-12-08 | Payer: MEDICARE

## 2017-12-08 ENCOUNTER — Encounter: Admit: 2017-12-08 | Discharge: 2017-12-08 | Payer: MEDICARE

## 2017-12-08 DIAGNOSIS — R55 Syncope and collapse: Principal | ICD-10-CM

## 2017-12-08 NOTE — Telephone Encounter
[id number]  k.

## 2017-12-08 NOTE — Telephone Encounter
-----   Message from Nichola Sizer, RN sent at 12/08/2017  1:11 PM CST -----  Regarding: FW: SDO LINQ pt with symptom triggered event      ----- Message -----  From: [name]  Sent: 12/08/2017  11:14 AM  To: Vickie Epley, RN  Subject: SDO LINQ pt with symptom triggered event         Presenting EGM on 12/08/2017 @ 00:04:50 shows AF.     Reviewed Event Report shows:    #53 Symptom 12/12 @ 14:34 the 30 sec strip shows an irregular R-R interval with V rates between 60's-100's.     Please see scanned data sheets for further review.  Results routed to Dr. Ricard Dillon for signature and review.

## 2017-12-14 ENCOUNTER — Encounter: Admit: 2017-12-14 | Discharge: 2017-12-14 | Payer: MEDICARE

## 2017-12-19 ENCOUNTER — Encounter: Admit: 2017-12-19 | Discharge: 2017-12-19 | Payer: MEDICARE

## 2017-12-28 ENCOUNTER — Encounter: Admit: 2017-12-28 | Discharge: 2017-12-28 | Payer: MEDICARE

## 2017-12-30 ENCOUNTER — Encounter: Admit: 2017-12-30 | Discharge: 2017-12-30 | Payer: MEDICARE

## 2018-01-03 ENCOUNTER — Encounter: Admit: 2018-01-03 | Discharge: 2018-01-03 | Payer: MEDICARE

## 2018-01-05 ENCOUNTER — Encounter: Admit: 2018-01-05 | Discharge: 2018-01-05 | Payer: MEDICARE

## 2018-01-05 ENCOUNTER — Ambulatory Visit: Admit: 2018-01-05 | Discharge: 2018-01-06 | Payer: MEDICARE

## 2018-01-05 DIAGNOSIS — I4819 Other persistent atrial fibrillation: Principal | ICD-10-CM

## 2018-01-05 DIAGNOSIS — R55 Syncope and collapse: ICD-10-CM

## 2018-01-05 DIAGNOSIS — E785 Hyperlipidemia, unspecified: Principal | ICD-10-CM

## 2018-01-05 DIAGNOSIS — I4892 Unspecified atrial flutter: ICD-10-CM

## 2018-01-05 DIAGNOSIS — D473 Essential (hemorrhagic) thrombocythemia: ICD-10-CM

## 2018-01-09 ENCOUNTER — Ambulatory Visit: Admit: 2018-01-09 | Discharge: 2018-01-10 | Payer: MEDICARE

## 2018-01-10 DIAGNOSIS — I481 Persistent atrial fibrillation: ICD-10-CM

## 2018-01-10 DIAGNOSIS — R55 Syncope and collapse: Principal | ICD-10-CM

## 2018-01-20 ENCOUNTER — Encounter: Admit: 2018-01-20 | Discharge: 2018-01-20 | Payer: MEDICARE

## 2018-01-24 ENCOUNTER — Encounter: Admit: 2018-01-24 | Discharge: 2018-01-24 | Payer: MEDICARE

## 2018-01-27 ENCOUNTER — Encounter: Admit: 2018-01-27 | Discharge: 2018-01-27 | Payer: MEDICARE

## 2018-01-31 ENCOUNTER — Encounter: Admit: 2018-01-31 | Discharge: 2018-01-31 | Payer: MEDICARE

## 2018-02-01 ENCOUNTER — Encounter: Admit: 2018-02-01 | Discharge: 2018-02-01 | Payer: MEDICARE

## 2018-02-06 ENCOUNTER — Encounter: Admit: 2018-02-06 | Discharge: 2018-02-06 | Payer: MEDICARE

## 2018-02-07 ENCOUNTER — Ambulatory Visit: Admit: 2018-02-07 | Discharge: 2018-02-08 | Payer: MEDICARE

## 2018-02-07 ENCOUNTER — Encounter: Admit: 2018-02-07 | Discharge: 2018-02-07 | Payer: MEDICARE

## 2018-02-08 DIAGNOSIS — R55 Syncope and collapse: Principal | ICD-10-CM

## 2018-02-17 ENCOUNTER — Ambulatory Visit: Admit: 2018-02-17 | Discharge: 2018-02-18 | Payer: MEDICARE

## 2018-02-17 ENCOUNTER — Encounter: Admit: 2018-02-17 | Discharge: 2018-02-17 | Payer: MEDICARE

## 2018-02-17 DIAGNOSIS — R55 Syncope and collapse: ICD-10-CM

## 2018-02-17 DIAGNOSIS — E785 Hyperlipidemia, unspecified: Principal | ICD-10-CM

## 2018-02-17 DIAGNOSIS — I4892 Unspecified atrial flutter: ICD-10-CM

## 2018-02-18 DIAGNOSIS — R000 Tachycardia, unspecified: ICD-10-CM

## 2018-02-18 DIAGNOSIS — R42 Dizziness and giddiness: ICD-10-CM

## 2018-02-18 DIAGNOSIS — R0602 Shortness of breath: ICD-10-CM

## 2018-02-18 DIAGNOSIS — R5383 Other fatigue: ICD-10-CM

## 2018-02-18 DIAGNOSIS — I43 Cardiomyopathy in diseases classified elsewhere: Secondary | ICD-10-CM

## 2018-02-18 DIAGNOSIS — I481 Persistent atrial fibrillation: Principal | ICD-10-CM

## 2018-02-18 DIAGNOSIS — R55 Syncope and collapse: ICD-10-CM

## 2018-02-18 DIAGNOSIS — I447 Left bundle-branch block, unspecified: ICD-10-CM

## 2018-02-18 DIAGNOSIS — I493 Ventricular premature depolarization: ICD-10-CM

## 2018-03-02 ENCOUNTER — Ambulatory Visit: Admit: 2018-03-02 | Discharge: 2018-03-03 | Payer: MEDICARE

## 2018-03-02 DIAGNOSIS — I480 Paroxysmal atrial fibrillation: ICD-10-CM

## 2018-03-02 DIAGNOSIS — R600 Localized edema: Principal | ICD-10-CM

## 2018-03-10 ENCOUNTER — Encounter: Admit: 2018-03-10 | Discharge: 2018-03-10 | Payer: MEDICARE

## 2018-03-10 ENCOUNTER — Ambulatory Visit: Admit: 2018-03-10 | Discharge: 2018-03-11 | Payer: MEDICARE

## 2018-03-11 DIAGNOSIS — R55 Syncope and collapse: Principal | ICD-10-CM

## 2018-03-13 ENCOUNTER — Encounter: Admit: 2018-03-13 | Discharge: 2018-03-13 | Payer: MEDICARE

## 2018-03-23 ENCOUNTER — Encounter: Admit: 2018-03-23 | Discharge: 2018-03-23 | Payer: MEDICARE

## 2018-04-10 ENCOUNTER — Ambulatory Visit: Admit: 2018-04-10 | Discharge: 2018-04-11 | Payer: MEDICARE

## 2018-04-11 ENCOUNTER — Ambulatory Visit: Admit: 2018-04-11 | Discharge: 2018-04-12 | Payer: MEDICARE

## 2018-04-11 ENCOUNTER — Encounter: Admit: 2018-04-11 | Discharge: 2018-04-11 | Payer: MEDICARE

## 2018-04-11 DIAGNOSIS — R55 Syncope and collapse: Principal | ICD-10-CM

## 2018-04-11 DIAGNOSIS — I4892 Unspecified atrial flutter: ICD-10-CM

## 2018-04-11 DIAGNOSIS — I4819 Other persistent atrial fibrillation: Principal | ICD-10-CM

## 2018-04-11 DIAGNOSIS — E785 Hyperlipidemia, unspecified: Principal | ICD-10-CM

## 2018-04-11 DIAGNOSIS — R000 Tachycardia, unspecified: ICD-10-CM

## 2018-04-17 ENCOUNTER — Encounter: Admit: 2018-04-17 | Discharge: 2018-04-17 | Payer: MEDICARE

## 2018-04-18 ENCOUNTER — Encounter: Admit: 2018-04-18 | Discharge: 2018-04-18 | Payer: MEDICARE

## 2018-05-11 ENCOUNTER — Ambulatory Visit: Admit: 2018-05-11 | Discharge: 2018-05-12 | Payer: MEDICARE

## 2018-05-12 DIAGNOSIS — R55 Syncope and collapse: Principal | ICD-10-CM

## 2018-05-12 DIAGNOSIS — I481 Persistent atrial fibrillation: Secondary | ICD-10-CM

## 2018-05-15 ENCOUNTER — Encounter: Admit: 2018-05-15 | Discharge: 2018-05-15 | Payer: MEDICARE

## 2018-05-16 ENCOUNTER — Encounter: Admit: 2018-05-16 | Discharge: 2018-05-16 | Payer: MEDICARE

## 2018-05-23 ENCOUNTER — Encounter: Admit: 2018-05-23 | Discharge: 2018-05-23 | Payer: MEDICARE

## 2018-05-23 DIAGNOSIS — R600 Localized edema: Principal | ICD-10-CM

## 2018-05-23 MED ORDER — POTASSIUM CHLORIDE 10 MEQ PO TBER
ORAL_TABLET | Freq: Every day | 3 refills | 30.00000 days | Status: AC
Start: 2018-05-23 — End: 2018-08-24

## 2018-05-24 ENCOUNTER — Encounter: Admit: 2018-05-24 | Discharge: 2018-05-24 | Payer: MEDICARE

## 2018-06-05 ENCOUNTER — Encounter: Admit: 2018-06-05 | Discharge: 2018-06-05 | Payer: MEDICARE

## 2018-06-06 ENCOUNTER — Encounter: Admit: 2018-06-06 | Discharge: 2018-06-06 | Payer: MEDICARE

## 2018-06-08 ENCOUNTER — Encounter: Admit: 2018-06-08 | Discharge: 2018-06-08 | Payer: MEDICARE

## 2018-06-12 ENCOUNTER — Ambulatory Visit: Admit: 2018-06-12 | Discharge: 2018-06-13 | Payer: MEDICARE

## 2018-06-13 DIAGNOSIS — I481 Persistent atrial fibrillation: ICD-10-CM

## 2018-06-13 DIAGNOSIS — R55 Syncope and collapse: Principal | ICD-10-CM

## 2018-07-04 ENCOUNTER — Encounter: Admit: 2018-07-04 | Discharge: 2018-07-04 | Payer: MEDICARE

## 2018-07-12 ENCOUNTER — Ambulatory Visit: Admit: 2018-07-12 | Discharge: 2018-07-13 | Payer: MEDICARE

## 2018-07-13 DIAGNOSIS — I481 Persistent atrial fibrillation: ICD-10-CM

## 2018-07-13 DIAGNOSIS — R55 Syncope and collapse: Principal | ICD-10-CM

## 2018-07-20 ENCOUNTER — Encounter: Admit: 2018-07-20 | Discharge: 2018-07-20 | Payer: MEDICARE

## 2018-08-01 ENCOUNTER — Encounter: Admit: 2018-08-01 | Discharge: 2018-08-01 | Payer: MEDICARE

## 2018-08-09 ENCOUNTER — Encounter: Admit: 2018-08-09 | Discharge: 2018-08-09 | Payer: MEDICARE

## 2018-08-10 ENCOUNTER — Encounter: Admit: 2018-08-10 | Discharge: 2018-08-10 | Payer: MEDICARE

## 2018-08-14 ENCOUNTER — Ambulatory Visit: Admit: 2018-08-14 | Discharge: 2018-08-15 | Payer: MEDICARE

## 2018-08-14 ENCOUNTER — Encounter: Admit: 2018-08-14 | Discharge: 2018-08-14 | Payer: MEDICARE

## 2018-08-15 DIAGNOSIS — R55 Syncope and collapse: Principal | ICD-10-CM

## 2018-08-23 LAB — COMPREHENSIVE METABOLIC PANEL
Lab: 0.5
Lab: 14
Lab: 140 — ABNORMAL LOW (ref 4.20–5.40)
Lab: 22
Lab: 22
Lab: 3.4
Lab: 6 — ABNORMAL LOW (ref 6.2–8.1)
Lab: 60
Lab: 85

## 2018-08-23 LAB — LIPID PROFILE
Lab: 120 — ABNORMAL HIGH (ref <100)
Lab: 14 — ABNORMAL LOW (ref 33.0–37.0)
Lab: 183 — ABNORMAL LOW (ref 12.0–16.0)
Lab: 4
Lab: 51 — ABNORMAL HIGH (ref 80.0–99.0)

## 2018-08-23 LAB — CBC: Lab: 8.6

## 2018-08-23 LAB — THYROID STIMULATING HORMONE-TSH: Lab: 3.8 — ABNORMAL LOW (ref 37.0–47.0)

## 2018-08-24 ENCOUNTER — Encounter: Admit: 2018-08-24 | Discharge: 2018-08-24 | Payer: MEDICARE

## 2018-08-24 ENCOUNTER — Ambulatory Visit: Admit: 2018-08-24 | Discharge: 2018-08-25 | Payer: MEDICARE

## 2018-08-24 DIAGNOSIS — I4819 Other persistent atrial fibrillation: ICD-10-CM

## 2018-08-24 DIAGNOSIS — D473 Essential (hemorrhagic) thrombocythemia: ICD-10-CM

## 2018-08-24 DIAGNOSIS — R55 Syncope and collapse: ICD-10-CM

## 2018-08-24 DIAGNOSIS — E785 Hyperlipidemia, unspecified: Principal | ICD-10-CM

## 2018-08-24 DIAGNOSIS — I459 Conduction disorder, unspecified: Principal | ICD-10-CM

## 2018-08-24 DIAGNOSIS — I4892 Unspecified atrial flutter: ICD-10-CM

## 2018-08-30 ENCOUNTER — Encounter: Admit: 2018-08-30 | Discharge: 2018-08-30 | Payer: MEDICARE

## 2018-09-13 ENCOUNTER — Encounter: Admit: 2018-09-13 | Discharge: 2018-09-13 | Payer: MEDICARE

## 2018-09-20 ENCOUNTER — Encounter: Admit: 2018-09-20 | Discharge: 2018-09-20 | Payer: MEDICARE

## 2018-09-25 ENCOUNTER — Encounter: Admit: 2018-09-25 | Discharge: 2018-09-25 | Payer: MEDICARE

## 2018-09-27 ENCOUNTER — Encounter: Admit: 2018-09-27 | Discharge: 2018-09-27 | Payer: MEDICARE

## 2018-09-27 MED ORDER — DILTIAZEM HCL 120 MG PO TAB
ORAL_TABLET | Freq: Two times a day (BID) | 3 refills | 45.00000 days | Status: AC
Start: 2018-09-27 — End: 2018-11-02

## 2018-09-27 MED ORDER — DIGOXIN 125 MCG PO TAB
ORAL_TABLET | Freq: Every day | ORAL | 3 refills | 30.00000 days | Status: AC
Start: 2018-09-27 — End: ?

## 2018-10-05 ENCOUNTER — Encounter: Admit: 2018-10-05 | Discharge: 2018-10-05 | Payer: MEDICARE

## 2018-10-09 ENCOUNTER — Encounter: Admit: 2018-10-09 | Discharge: 2018-10-09 | Payer: MEDICARE

## 2018-10-13 ENCOUNTER — Ambulatory Visit: Admit: 2018-10-13 | Discharge: 2018-10-14 | Payer: MEDICARE

## 2018-10-14 DIAGNOSIS — I459 Conduction disorder, unspecified: ICD-10-CM

## 2018-10-14 DIAGNOSIS — I4819 Other persistent atrial fibrillation: Principal | ICD-10-CM

## 2018-10-16 ENCOUNTER — Encounter: Admit: 2018-10-16 | Discharge: 2018-10-16 | Payer: MEDICARE

## 2018-10-16 DIAGNOSIS — I459 Conduction disorder, unspecified: Principal | ICD-10-CM

## 2018-10-17 ENCOUNTER — Encounter: Admit: 2018-10-17 | Discharge: 2018-10-17 | Payer: MEDICARE

## 2018-10-19 ENCOUNTER — Encounter: Admit: 2018-10-19 | Discharge: 2018-10-19 | Payer: MEDICARE

## 2018-11-02 ENCOUNTER — Ambulatory Visit: Admit: 2018-11-02 | Discharge: 2018-11-03 | Payer: MEDICARE

## 2018-11-02 ENCOUNTER — Encounter: Admit: 2018-11-02 | Discharge: 2018-11-02 | Payer: MEDICARE

## 2018-11-02 DIAGNOSIS — E785 Hyperlipidemia, unspecified: Principal | ICD-10-CM

## 2018-11-02 DIAGNOSIS — I4892 Unspecified atrial flutter: ICD-10-CM

## 2018-11-02 DIAGNOSIS — I5022 Chronic systolic (congestive) heart failure: Principal | ICD-10-CM

## 2018-11-02 DIAGNOSIS — R55 Syncope and collapse: ICD-10-CM

## 2018-11-02 MED ORDER — FUROSEMIDE 20 MG PO TAB
20 mg | ORAL_TABLET | Freq: Every morning | ORAL | 3 refills | 90.00000 days | Status: AC
Start: 2018-11-02 — End: ?

## 2018-11-02 MED ORDER — POTASSIUM CHLORIDE 20 MEQ PO TBTQ
20 meq | ORAL_TABLET | Freq: Every day | ORAL | 3 refills | 30.00000 days | Status: AC
Start: 2018-11-02 — End: ?

## 2018-11-02 MED ORDER — METOPROLOL TARTRATE 25 MG PO TAB
25 mg | ORAL_TABLET | Freq: Two times a day (BID) | ORAL | 3 refills | 90.00000 days | Status: AC
Start: 2018-11-02 — End: ?

## 2018-11-13 ENCOUNTER — Ambulatory Visit: Admit: 2018-11-13 | Discharge: 2018-11-13 | Payer: MEDICARE

## 2018-11-13 DIAGNOSIS — I459 Conduction disorder, unspecified: Principal | ICD-10-CM

## 2018-12-04 LAB — COMPREHENSIVE METABOLIC PANEL
Lab: 0.8
Lab: 1.1 — ABNORMAL HIGH (ref 0.57–1.11)
Lab: 100
Lab: 122
Lab: 137
Lab: 149 — ABNORMAL HIGH (ref 83–110)
Lab: 24
Lab: 25 — ABNORMAL HIGH (ref 9.8–20.1)
Lab: 3.9
Lab: 7.3
Lab: 85 — ABNORMAL HIGH (ref 5–34)
Lab: 9.4

## 2018-12-04 LAB — TROPONIN-I: Lab: 0.3 — ABNORMAL HIGH (ref 0.00–0.06)

## 2018-12-05 ENCOUNTER — Ambulatory Visit: Admit: 2018-12-05 | Discharge: 2018-12-05 | Payer: MEDICARE

## 2018-12-05 LAB — BASIC METABOLIC PANEL
Lab: 0.9 — ABNORMAL LOW (ref 33.0–37.0)
Lab: 16 — ABNORMAL HIGH (ref 0–14)
Lab: 23 — ABNORMAL HIGH (ref 9.8–20.1)
Lab: 60
Lab: 78 — ABNORMAL LOW (ref 83–110)
Lab: 8.7

## 2018-12-07 ENCOUNTER — Encounter: Admit: 2018-12-07 | Discharge: 2018-12-07 | Payer: MEDICARE

## 2018-12-08 ENCOUNTER — Encounter: Admit: 2018-12-08 | Discharge: 2018-12-08 | Payer: MEDICARE

## 2018-12-08 MED ORDER — ELIQUIS 2.5 MG PO TAB
ORAL_TABLET | Freq: Two times a day (BID) | 11 refills | Status: AC
Start: 2018-12-08 — End: ?

## 2018-12-14 ENCOUNTER — Ambulatory Visit: Admit: 2018-12-14 | Discharge: 2018-12-15 | Payer: MEDICARE

## 2018-12-15 DIAGNOSIS — I459 Conduction disorder, unspecified: Principal | ICD-10-CM

## 2018-12-26 ENCOUNTER — Encounter: Admit: 2018-12-26 | Discharge: 2018-12-26 | Payer: MEDICARE

## 2018-12-30 LAB — COMPREHENSIVE METABOLIC PANEL
Lab: 1
Lab: 1
Lab: 117 — ABNORMAL HIGH (ref 70–105)
Lab: 135 — ABNORMAL LOW (ref 136–145)
Lab: 19 — ABNORMAL HIGH (ref 0–14)
Lab: 19 — ABNORMAL HIGH (ref 7.0–18.7)
Lab: 22 — ABNORMAL LOW (ref 23–31)
Lab: 26
Lab: 29
Lab: 4
Lab: 4.7
Lab: 53 — ABNORMAL LOW (ref >59)
Lab: 7.2
Lab: 84
Lab: 9.3
Lab: 99

## 2019-01-01 ENCOUNTER — Encounter: Admit: 2019-01-01 | Discharge: 2019-01-01 | Payer: MEDICARE

## 2019-01-04 ENCOUNTER — Encounter: Admit: 2019-01-04 | Discharge: 2019-01-04 | Payer: MEDICARE

## 2019-01-17 ENCOUNTER — Ambulatory Visit: Admit: 2019-01-16 | Discharge: 2019-01-17 | Payer: MEDICARE

## 2019-01-17 DIAGNOSIS — I459 Conduction disorder, unspecified: Secondary | ICD-10-CM

## 2019-02-01 ENCOUNTER — Encounter: Admit: 2019-02-01 | Discharge: 2019-02-01 | Payer: MEDICARE

## 2019-02-20 ENCOUNTER — Encounter: Admit: 2019-02-20 | Discharge: 2019-02-20 | Payer: MEDICARE

## 2019-03-09 ENCOUNTER — Encounter: Admit: 2019-03-09 | Discharge: 2019-03-09 | Payer: MEDICARE

## 2019-03-21 ENCOUNTER — Encounter: Admit: 2019-03-21 | Discharge: 2019-03-21 | Payer: MEDICARE

## 2019-04-10 ENCOUNTER — Encounter: Admit: 2019-04-10 | Discharge: 2019-04-10 | Payer: MEDICARE

## 2019-04-11 ENCOUNTER — Encounter: Admit: 2019-04-11 | Discharge: 2019-04-11 | Payer: MEDICARE

## 2019-08-01 IMAGING — CR UP_EXM
3 series · 3 of 3 positions shown · non-contrast
Comparison: none

[elbow lat]
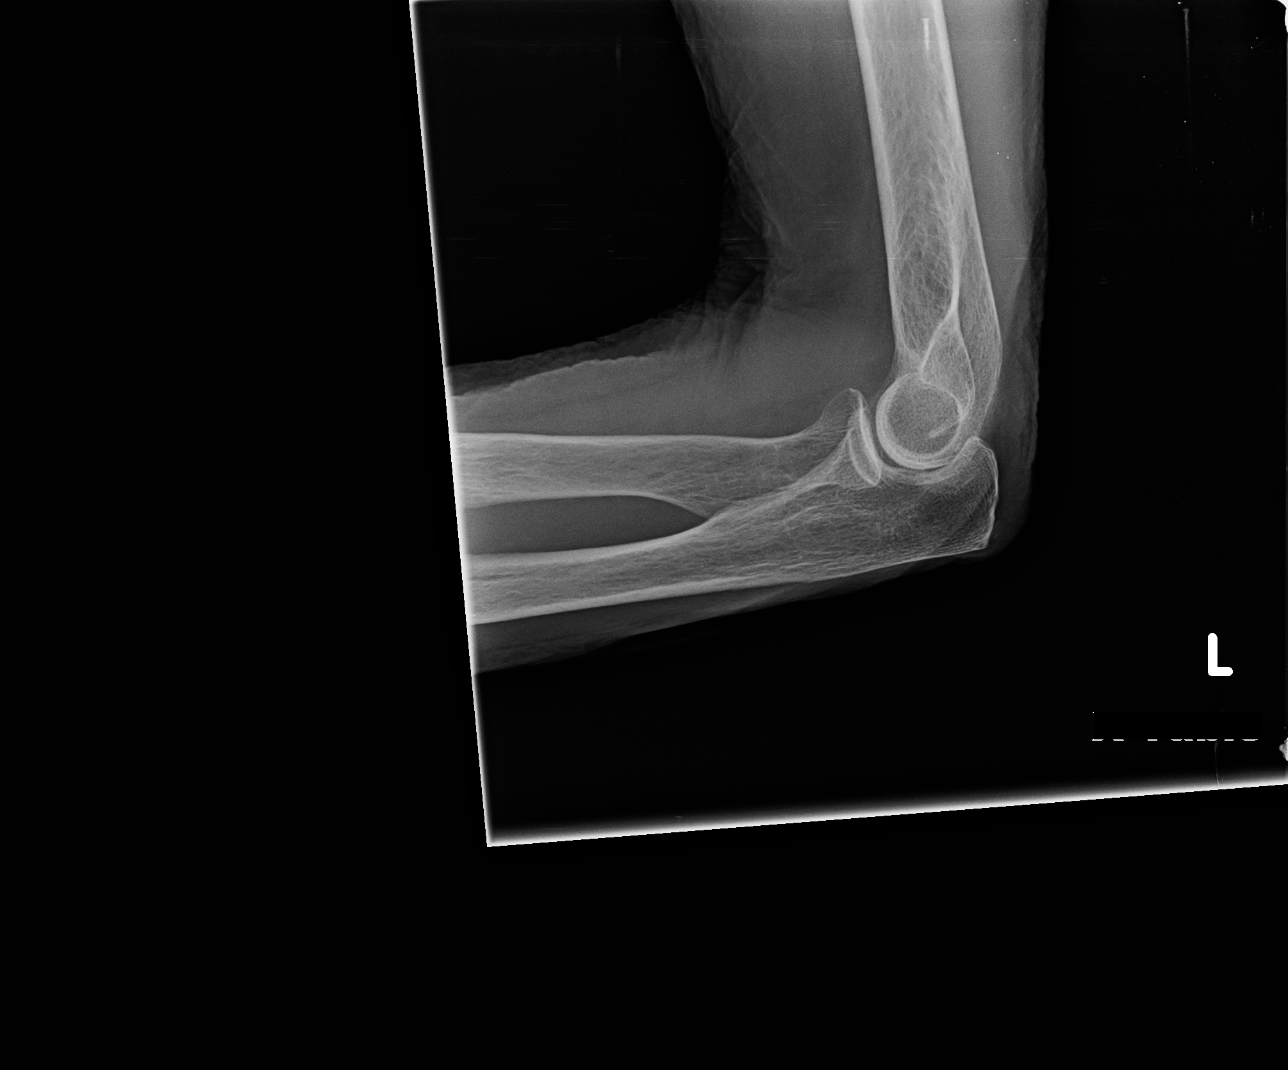

[elbow ap]
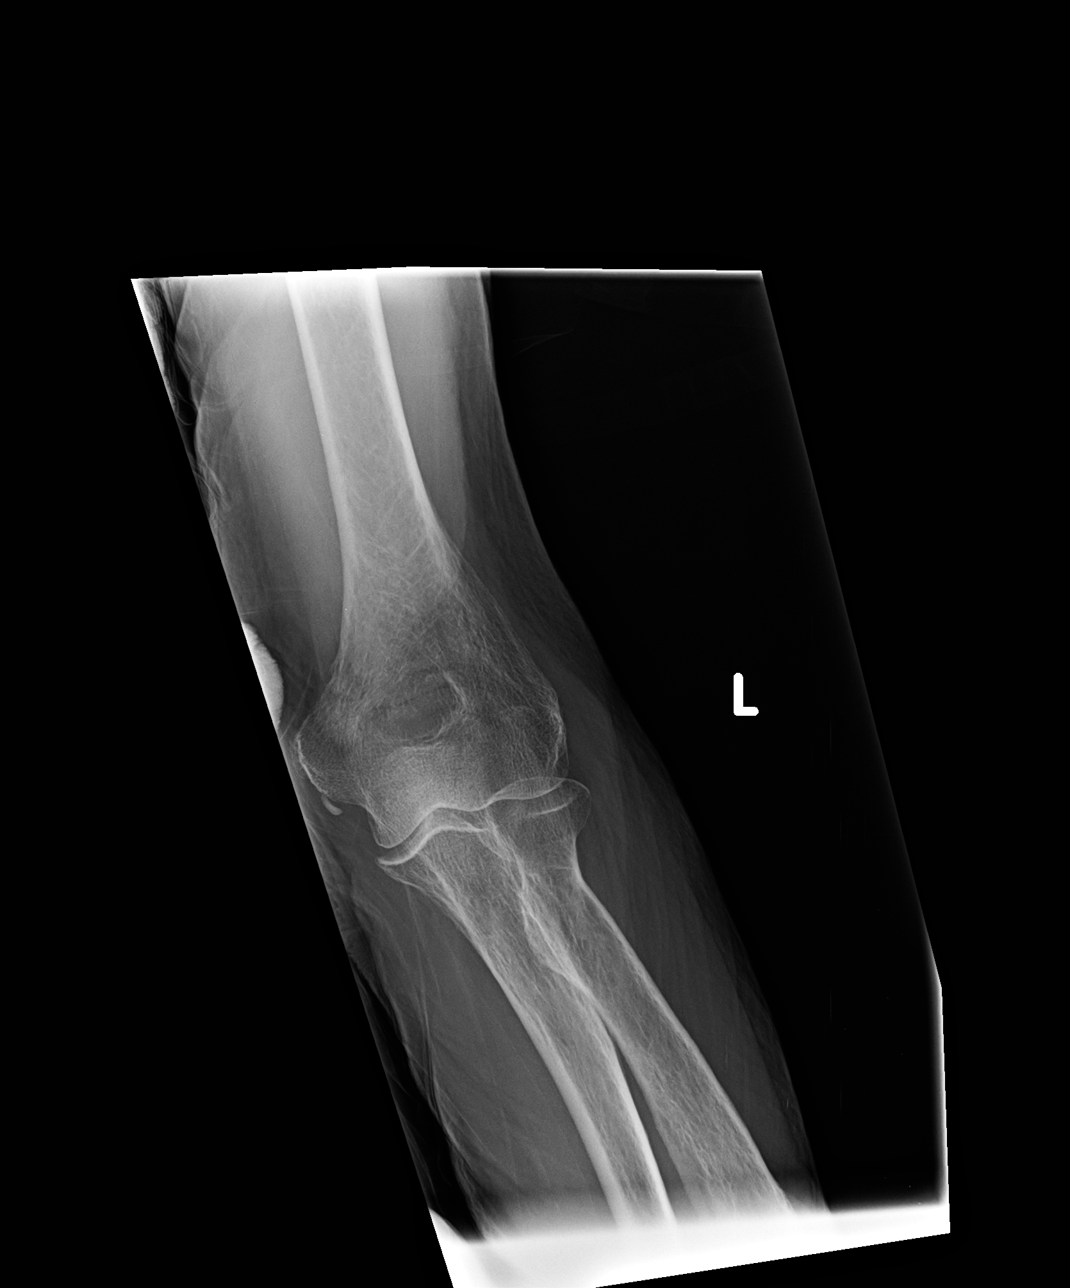

[elbow obl]
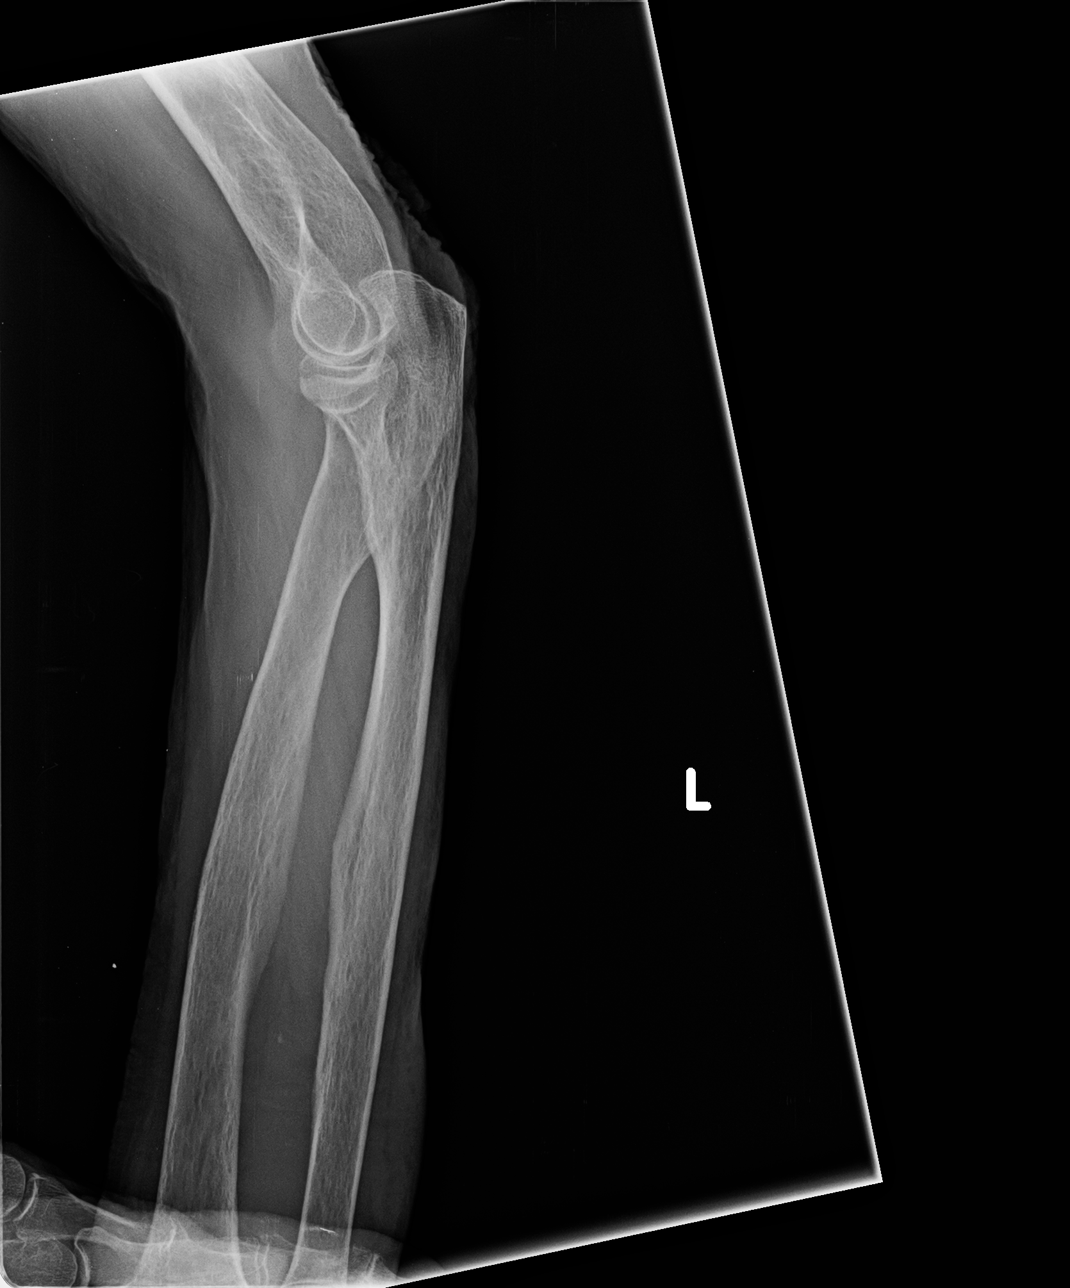

[3 of 3 positions shown; findings below may reference images not displayed]

EXAM
Left elbow.

INDICATION
fall
Fall this morning. Pain, swelling and bruising.

FINDINGS
Three views of the left elbow were obtained.
There is slight cortical irregularity of the articular surface of the radial head which is most
likely artifactual. There is no joint effusion.

IMPRESSION
Slight contour deformity of the articular surface of the radial head is most likely artifactual. A
hairline fracture is considered less likely. There is no joint effusion.

Tech Notes:

Fall this morning.  Pain, swelling and bruising.

## 2019-08-01 IMAGING — CR UP_EXM
3 series · 3 of 3 positions shown · non-contrast
Comparison: none

[wrist pa (1 of 2)]
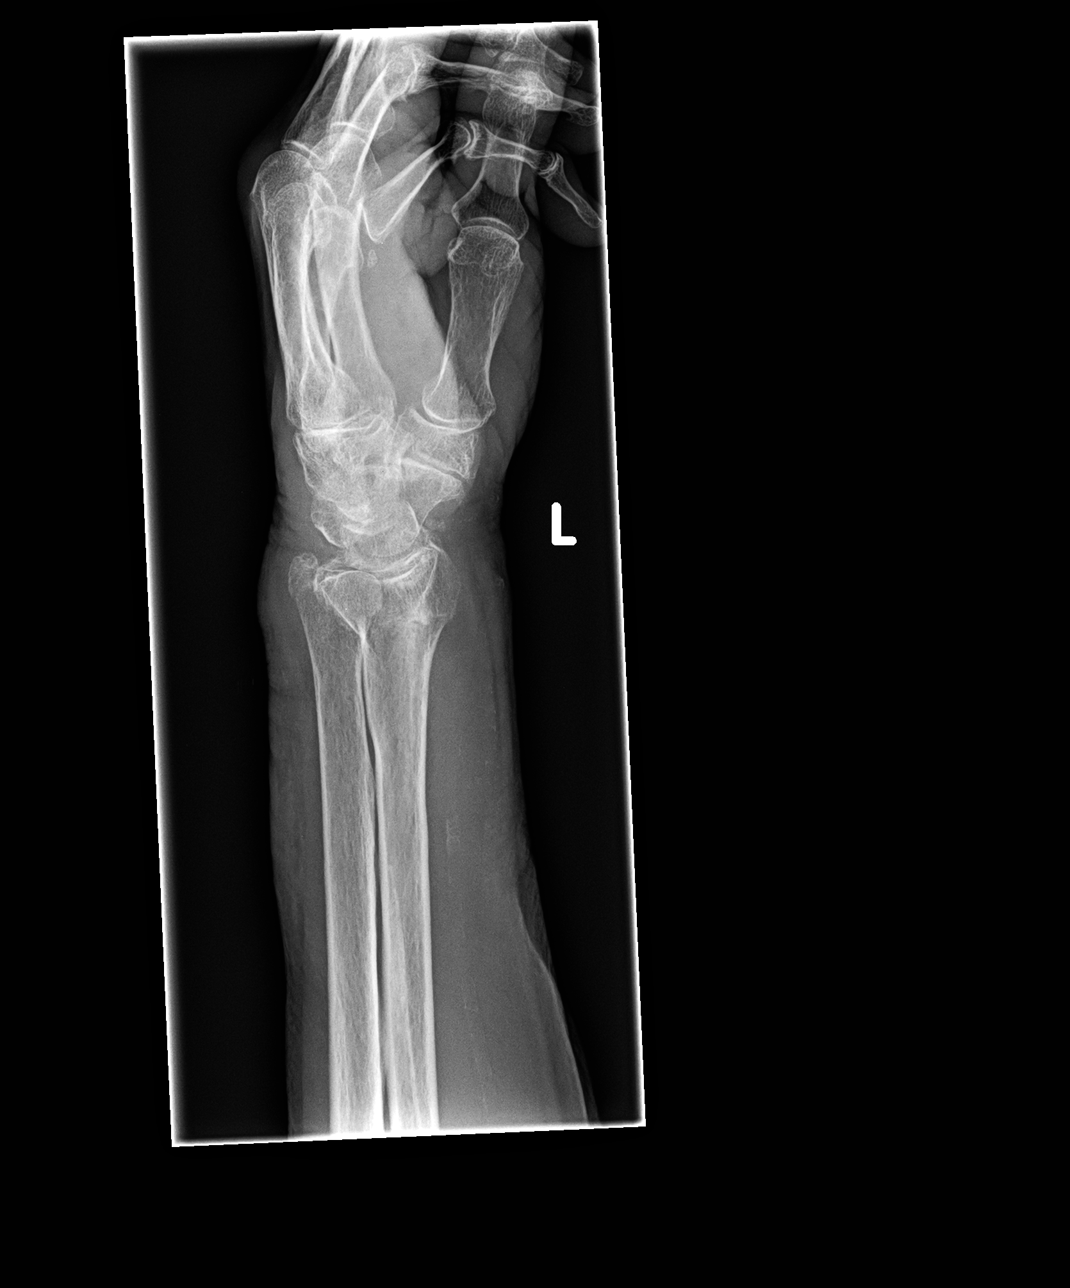

[wrist obl]
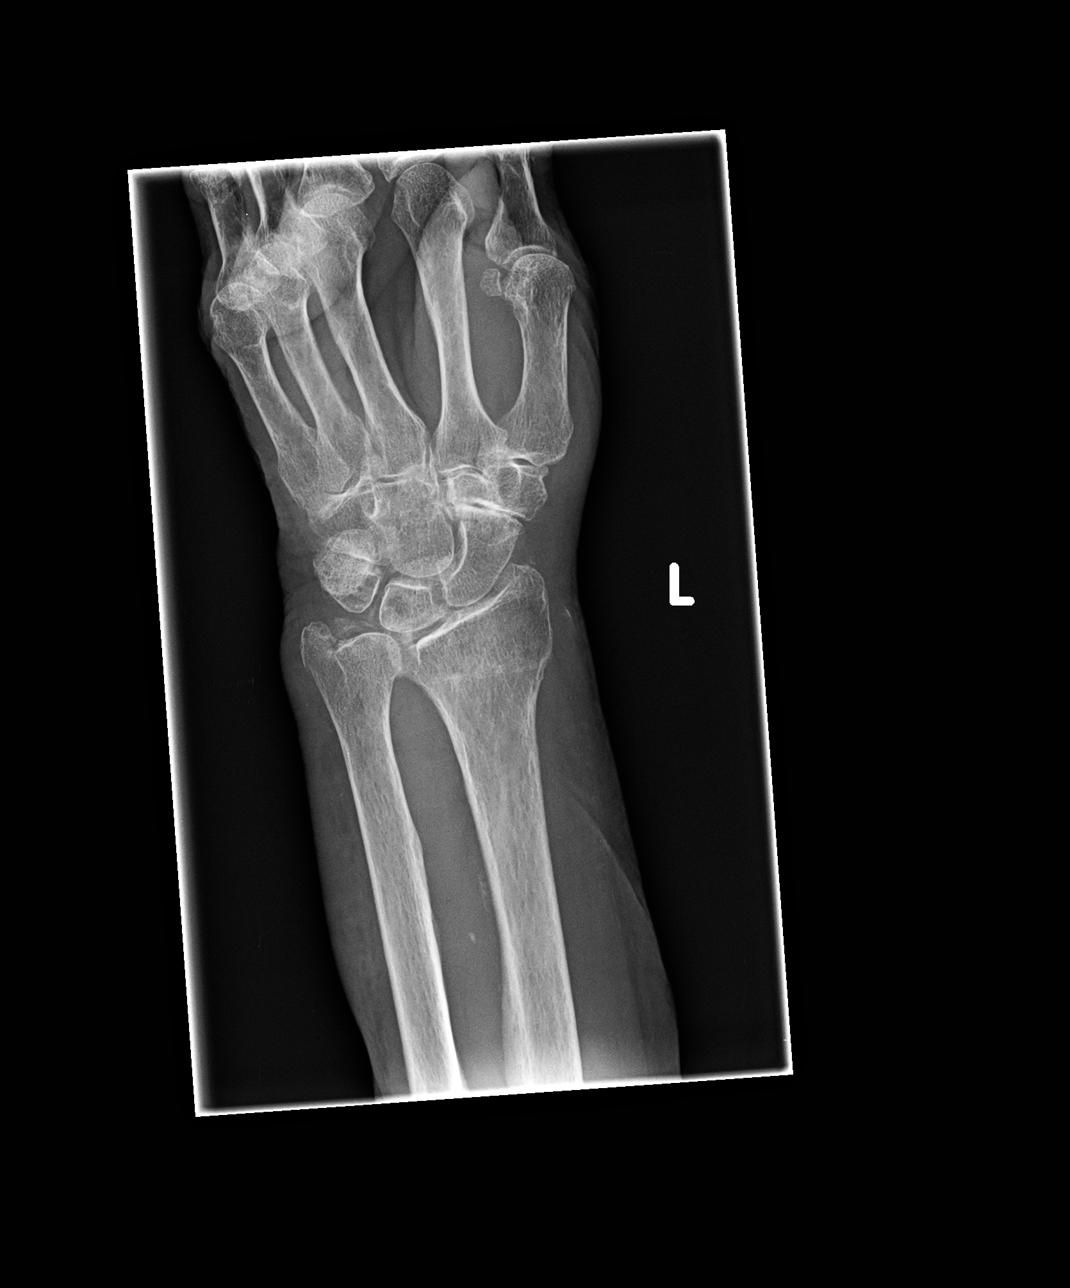

[wrist pa (2 of 2)]
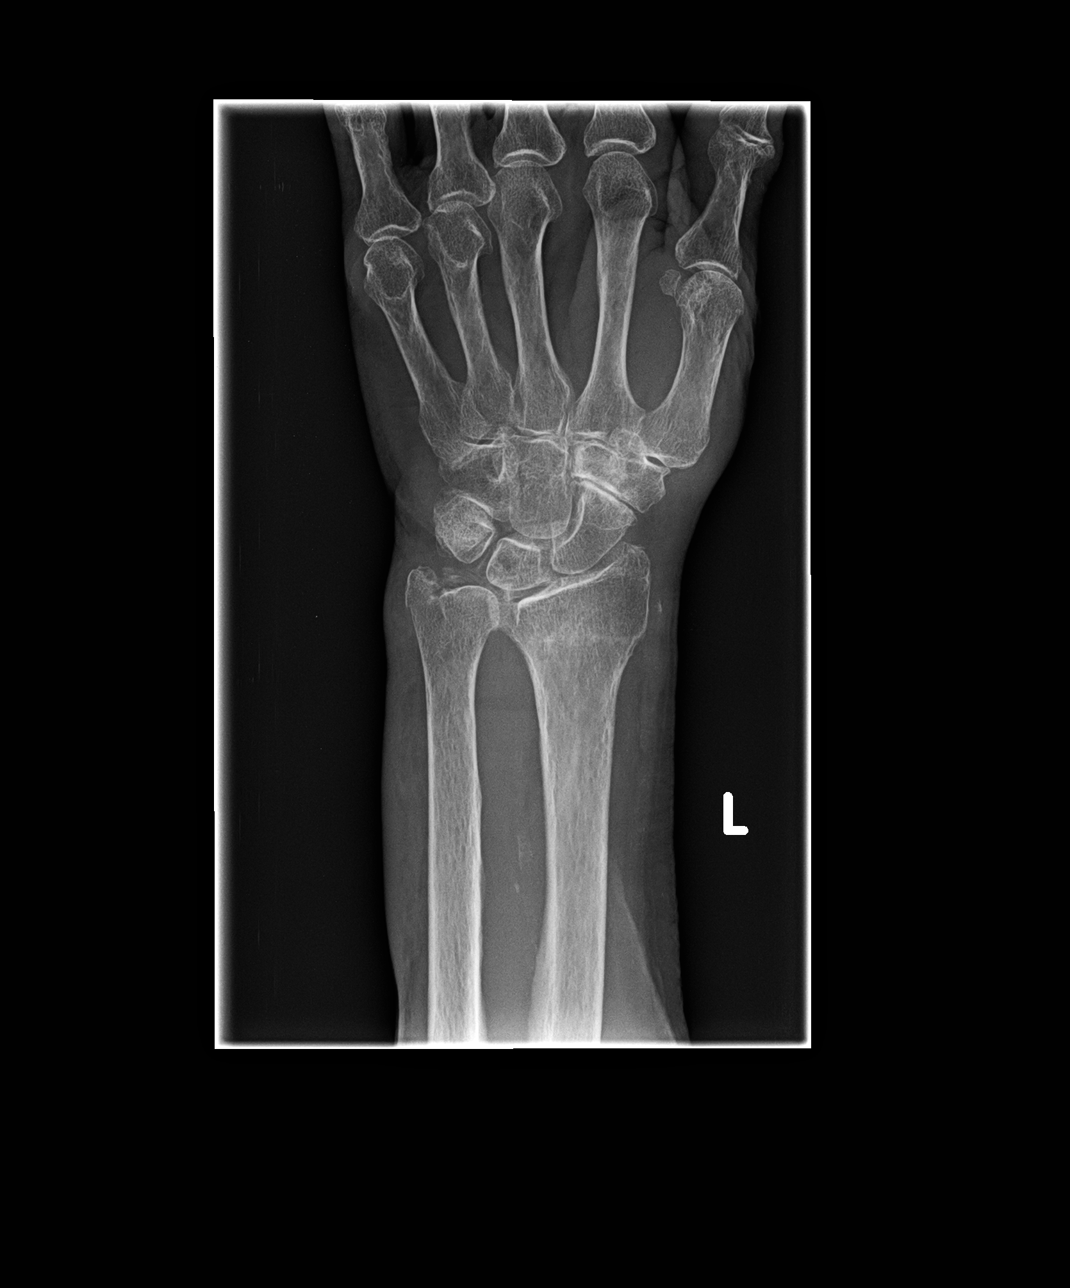

[3 of 3 positions shown; findings below may reference images not displayed]

EXAM
Left wrist.

INDICATION
fall/pain
Fall this morning. Pain, swelling and bruising.

FINDINGS
Three views of the left wrist were obtained.
There is a fracture of the distal left radius transversely and obliquely. There is involvement of
the articular surface of the distal radius.
There is a fracture of the base of the ulnar styloid.
There is pronounced narrowing of the triscaphe joint space.

IMPRESSION
There is a comminuted intra-articular fracture of the distal left radius with mild impaction of
fragments. There is a fracture of the base of the ulnar styloid.

Tech Notes:

Fall this morning.  Pain, swelling and bruising.

## 2019-08-01 IMAGING — CR CHEST
3 series · 3 of 3 positions shown · non-contrast
Comparison: none

[shoulder internal]
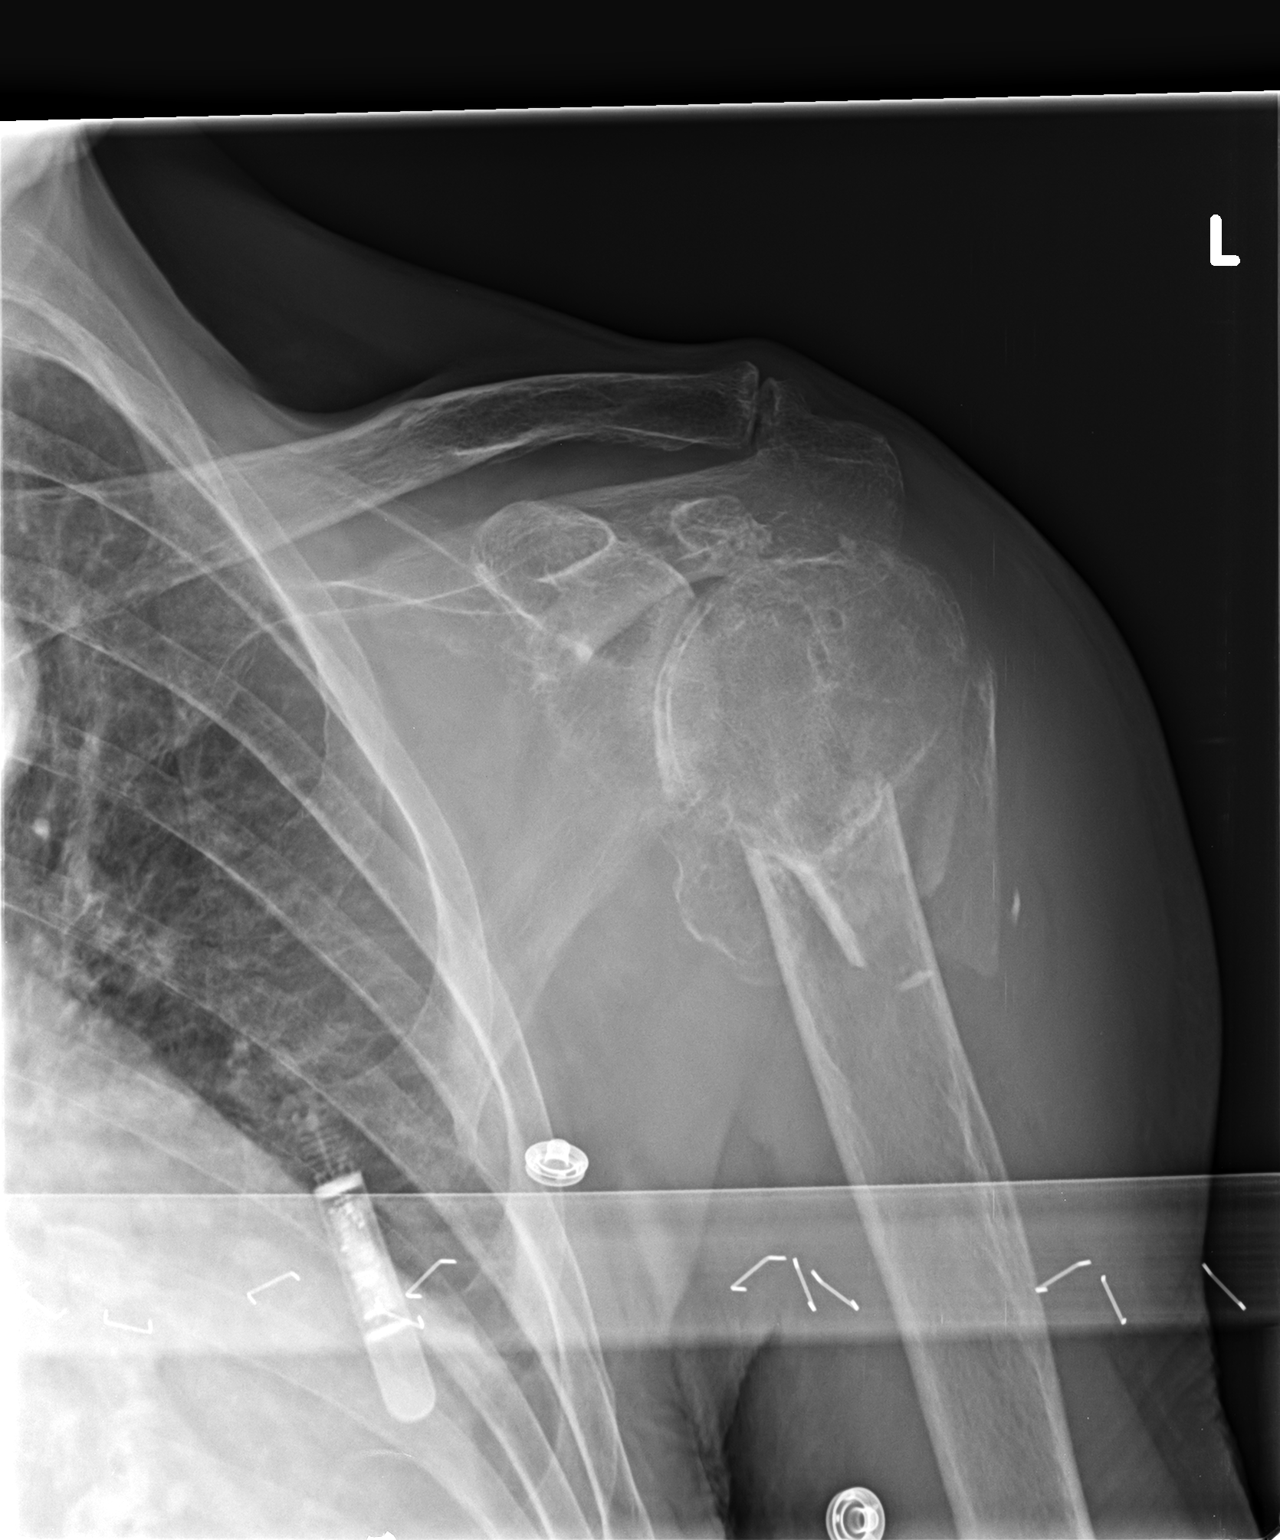

[shoulder external]
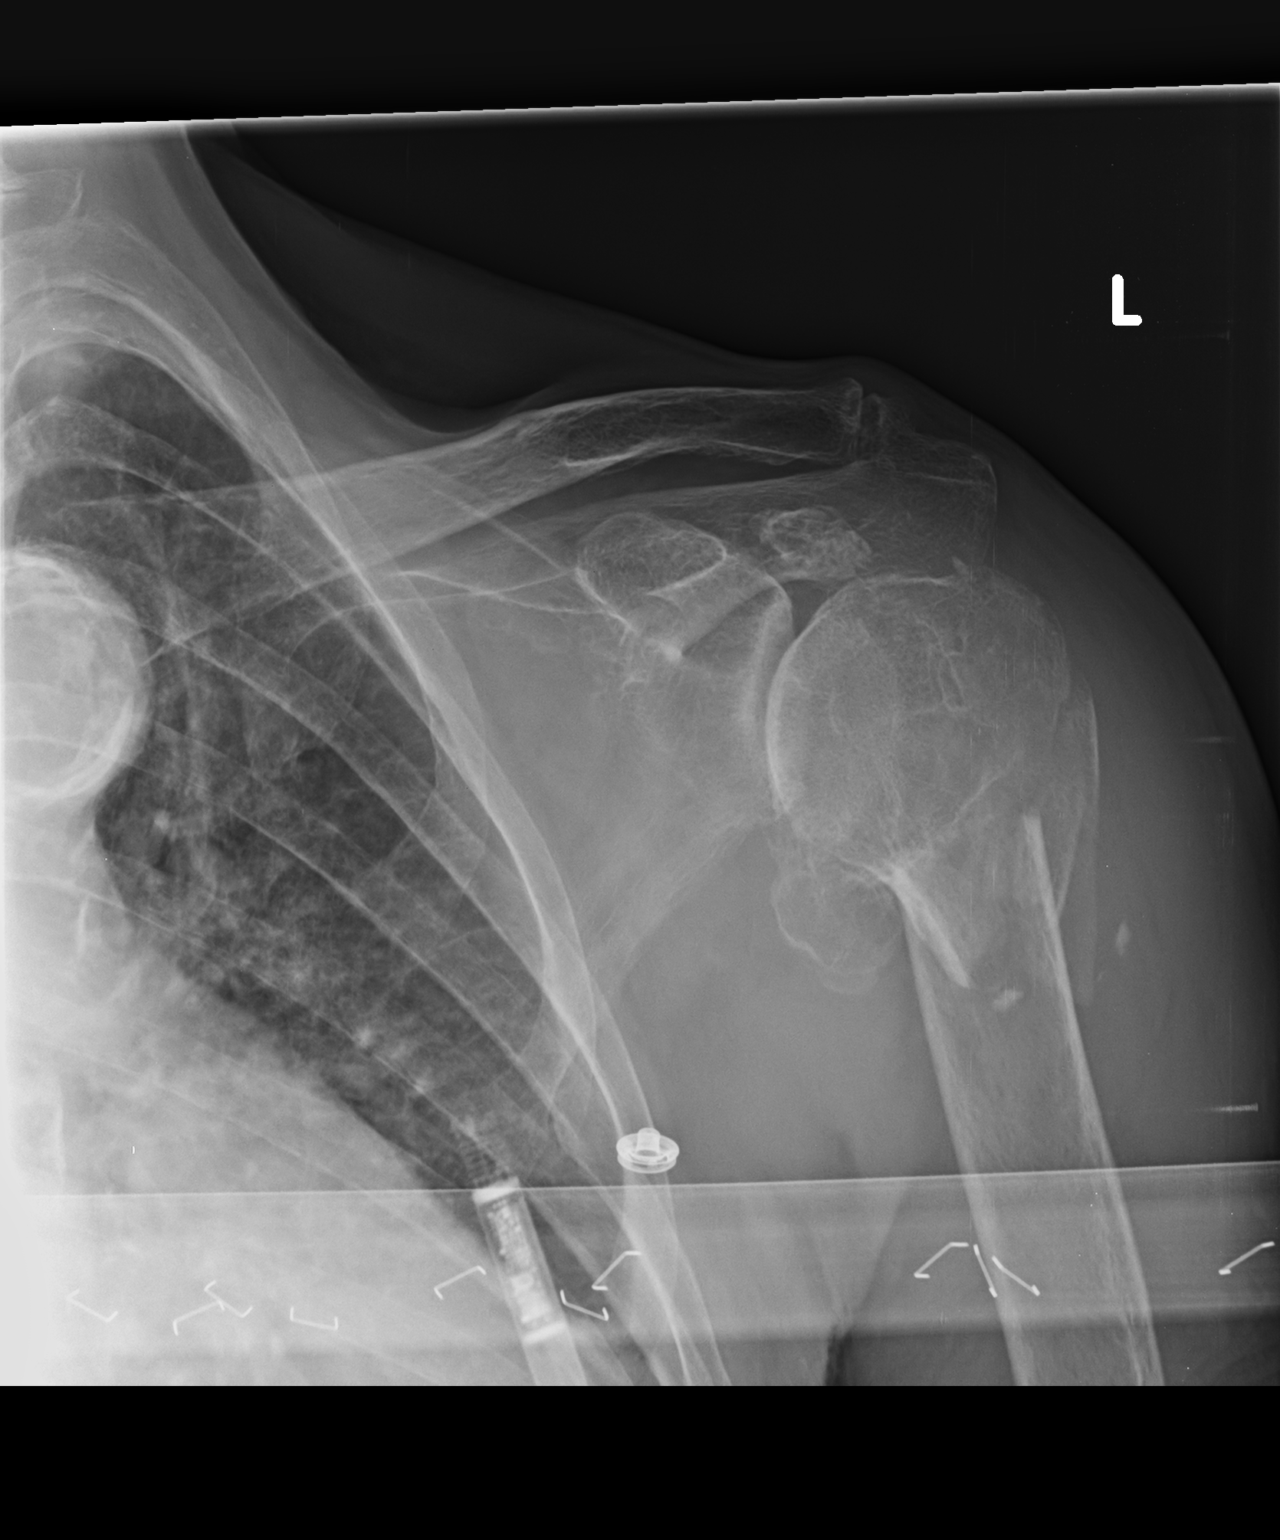

[shoulder y-view]
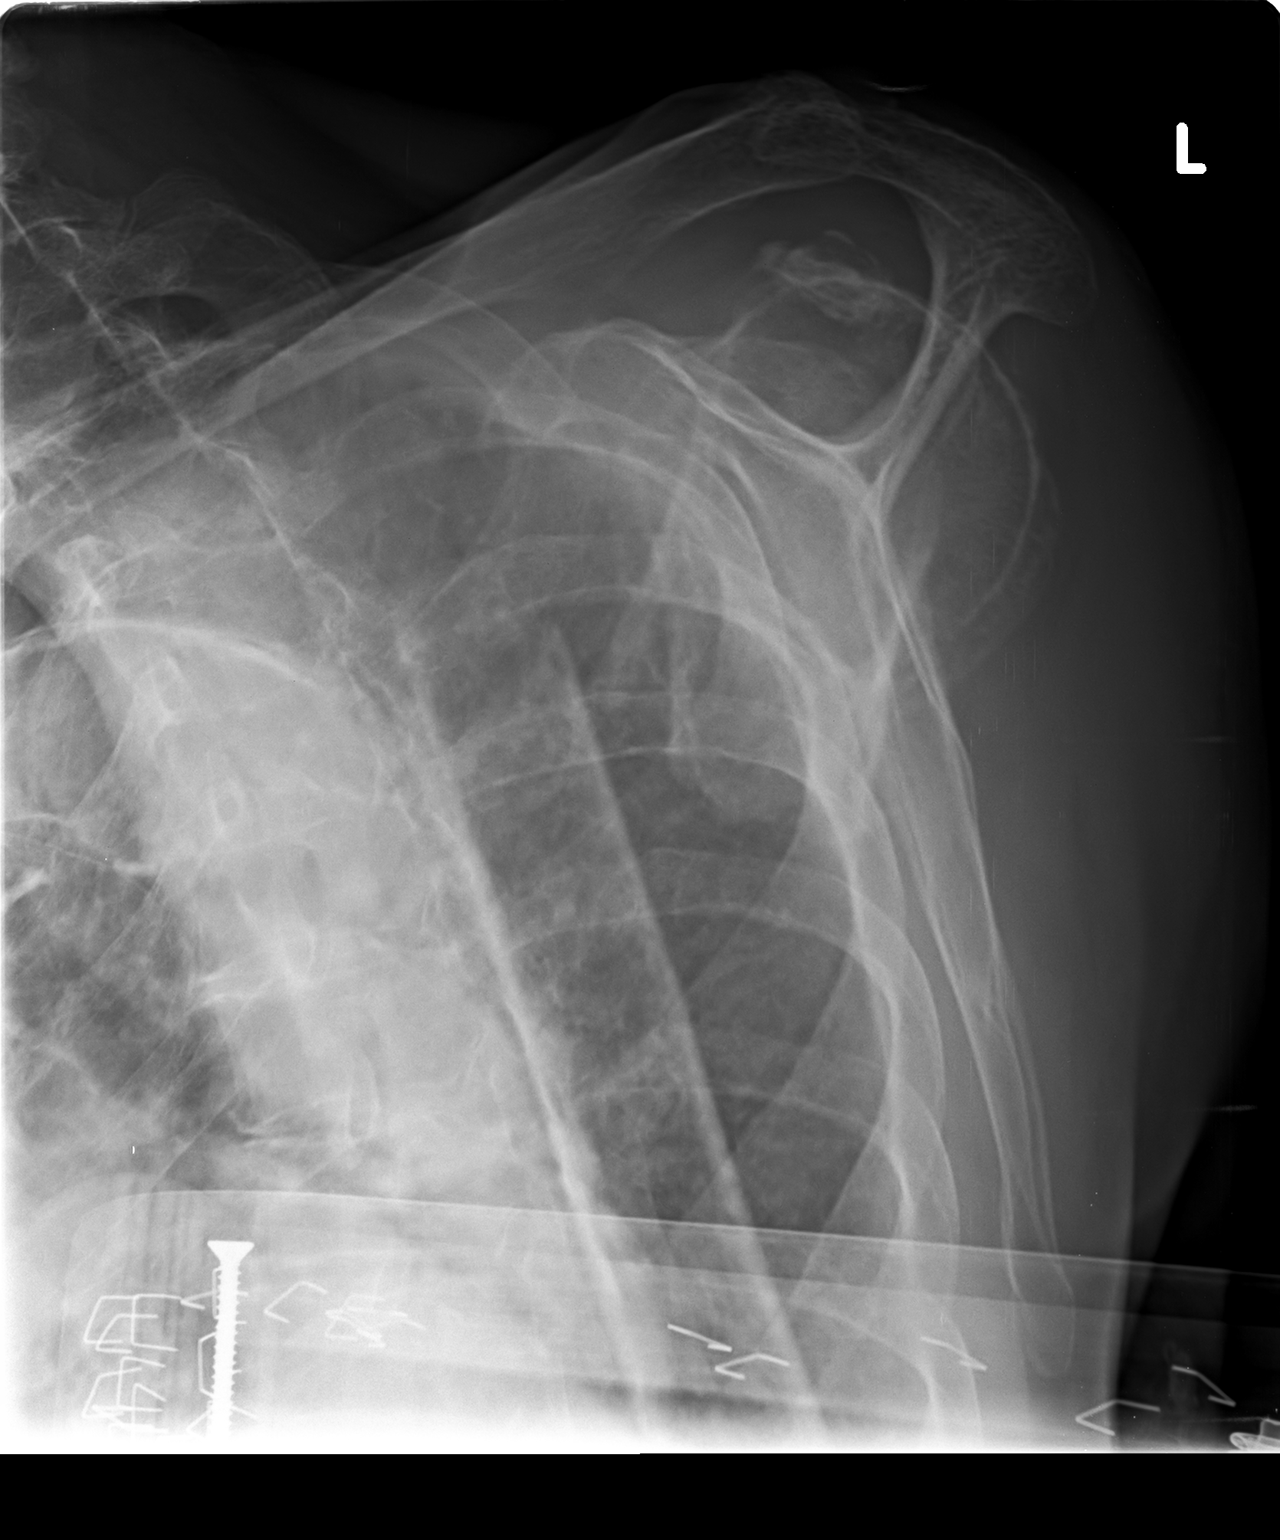

[3 of 3 positions shown; findings below may reference images not displayed]

EXAM
Left shoulder.

INDICATION
fall/pain
Fall this morning. Pain, swelling and bruising.

FINDINGS
Three views of left shoulder were obtained.
There is a comminuted fracture of the left humeral neck. There is anterior displacement of the main
distal fragment.

IMPRESSION
There is a comminuted fracture of the left humeral neck with anterior displacement of the main
distal fragment.

Tech Notes:

Fall this morning.  Pain, swelling and bruising.

## 2019-08-01 IMAGING — CR CHEST
1 series · 1 of 1 positions shown · non-contrast
Comparison: none

[chest port x-wise]
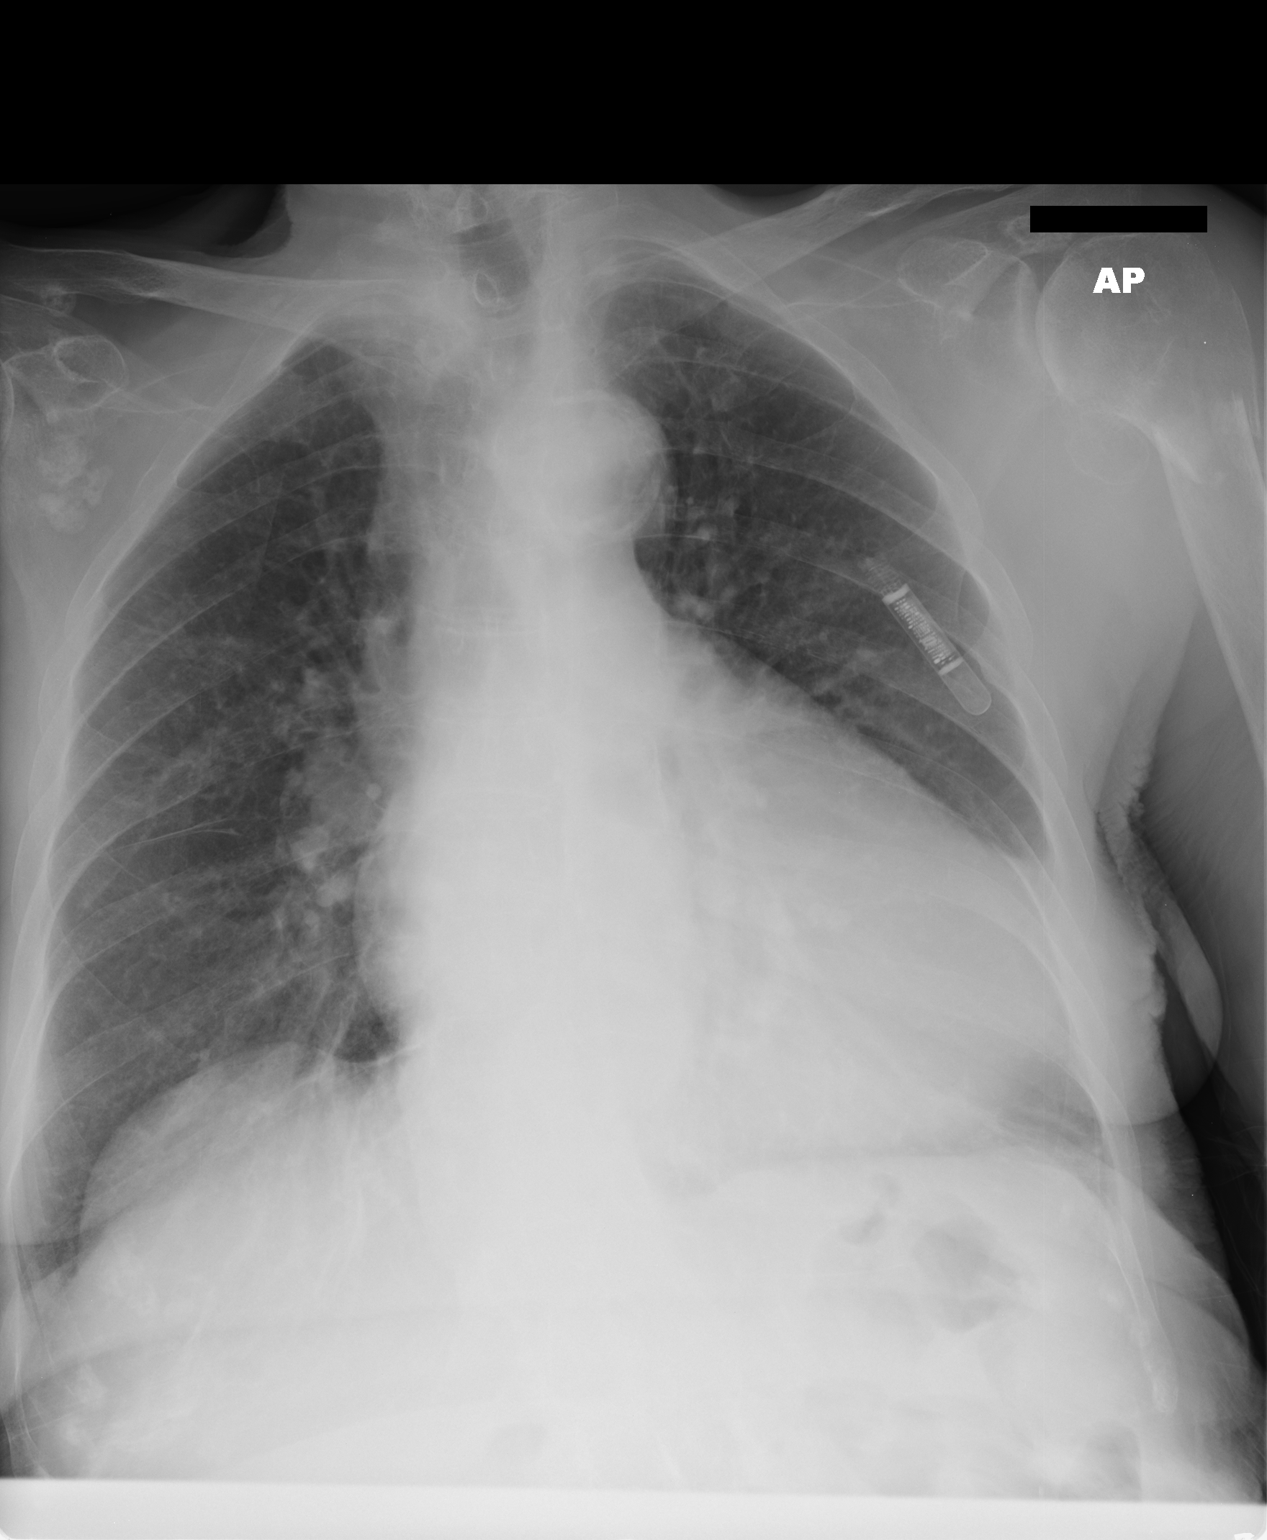

[1 of 1 positions shown; findings below may reference images not displayed]

EXAM
Portable chest x-ray.

INDICATION
fall fx
Recent fall and fracture. BG

FINDINGS
The prior study was reviewed from 08/05/2018.
There is cardiomegaly.
The lungs are clear. The pulmonary vasculature is normal.
There is no evidence of pneumothorax.
Cardiac monitor is present on the left.

IMPRESSION
There is cardiomegaly with no acute appearing abnormality of the chest.

Tech Notes:

Recent fall and fracture.
BG

## 2020-03-20 ENCOUNTER — Encounter: Admit: 2020-03-20 | Discharge: 2020-03-20 | Payer: MEDICARE

## 2020-03-20 NOTE — Telephone Encounter
Received notification that  [employer] has not been connected since 12/28/2018. Left voicemail for patient's daughter (patient's phone number has been disconnected): Patient was instructed to look at her transmitter to make sure that it is plugged into power and send a manual transmission to reconnect the transmitter. If she has any questions about how to send a transmission or if the transmitter does not appear to be working properly, they need to contact the device company directly. Patient was provided with that contact number. Requested the patient send [place] a MyChart message or contact our device nurses at [phone] to let us know after they have sent their transmission. NC

## 2020-05-20 ENCOUNTER — Encounter: Admit: 2020-05-20 | Discharge: 2020-05-20 | Payer: MEDICARE

## 2020-05-20 NOTE — Telephone Encounter
-----   Message from Cammy Brochure, RN sent at 05/20/2020 10:40 AM CDT -----  Regarding: Remote Deactivated - SDO  Multiple attempts have been made to contact the patient regarding her disconnected remote transmitter to include by phone, letter and certified mail without a response. She was deactivated from the remote monitoring website today due to non-compliance. Just an FYI. Thank you.

## 2023-02-25 DEATH — deceased

## 2023-03-16 ENCOUNTER — Encounter: Admit: 2023-03-16 | Discharge: 2023-03-16 | Payer: MEDICARE
